# Patient Record
Sex: Female | Born: 1945 | Race: White | Hispanic: No | Marital: Married | Smoking: Former smoker
Health system: Southern US, Community
[De-identification: ages and names within clinical notes are randomized; demographics above are authoritative.]

## PROBLEM LIST (undated history)

## (undated) DIAGNOSIS — E785 Hyperlipidemia, unspecified: Secondary | ICD-10-CM

## (undated) DIAGNOSIS — G2581 Restless legs syndrome: Secondary | ICD-10-CM

## (undated) DIAGNOSIS — B029 Zoster without complications: Secondary | ICD-10-CM

## (undated) DIAGNOSIS — F329 Major depressive disorder, single episode, unspecified: Secondary | ICD-10-CM

## (undated) DIAGNOSIS — B019 Varicella without complication: Secondary | ICD-10-CM

## (undated) DIAGNOSIS — J449 Chronic obstructive pulmonary disease, unspecified: Secondary | ICD-10-CM

## (undated) DIAGNOSIS — F32A Depression, unspecified: Secondary | ICD-10-CM

## (undated) DIAGNOSIS — I10 Essential (primary) hypertension: Secondary | ICD-10-CM

## (undated) HISTORY — DX: Varicella without complication: B01.9

## (undated) HISTORY — PX: BREAST LUMPECTOMY: SHX2

## (undated) HISTORY — DX: Depression, unspecified: F32.A

## (undated) HISTORY — DX: Zoster without complications: B02.9

## (undated) HISTORY — DX: Hyperlipidemia, unspecified: E78.5

## (undated) HISTORY — DX: Major depressive disorder, single episode, unspecified: F32.9

---

## 2005-09-08 ENCOUNTER — Ambulatory Visit: Payer: Self-pay

## 2006-11-17 ENCOUNTER — Ambulatory Visit: Payer: Self-pay

## 2008-03-21 ENCOUNTER — Ambulatory Visit: Payer: Self-pay

## 2009-10-17 LAB — HM PAP SMEAR: HM Pap smear: NORMAL

## 2011-03-09 ENCOUNTER — Emergency Department (HOSPITAL_COMMUNITY): Payer: Medicare Other

## 2011-03-09 ENCOUNTER — Encounter: Payer: Self-pay | Admitting: *Deleted

## 2011-03-09 ENCOUNTER — Emergency Department (HOSPITAL_COMMUNITY)
Admission: EM | Admit: 2011-03-09 | Discharge: 2011-03-09 | Disposition: A | Discharge status: Home / Self Care | Payer: Medicare Other | Attending: Emergency Medicine | Admitting: Emergency Medicine

## 2011-03-09 DIAGNOSIS — E119 Type 2 diabetes mellitus without complications: Secondary | ICD-10-CM | POA: Insufficient documentation

## 2011-03-09 DIAGNOSIS — J449 Chronic obstructive pulmonary disease, unspecified: Secondary | ICD-10-CM | POA: Insufficient documentation

## 2011-03-09 DIAGNOSIS — I10 Essential (primary) hypertension: Secondary | ICD-10-CM | POA: Insufficient documentation

## 2011-03-09 DIAGNOSIS — J4489 Other specified chronic obstructive pulmonary disease: Secondary | ICD-10-CM | POA: Insufficient documentation

## 2011-03-09 DIAGNOSIS — Z7982 Long term (current) use of aspirin: Secondary | ICD-10-CM | POA: Insufficient documentation

## 2011-03-09 DIAGNOSIS — R059 Cough, unspecified: Secondary | ICD-10-CM | POA: Insufficient documentation

## 2011-03-09 DIAGNOSIS — R05 Cough: Secondary | ICD-10-CM | POA: Insufficient documentation

## 2011-03-09 DIAGNOSIS — J40 Bronchitis, not specified as acute or chronic: Secondary | ICD-10-CM

## 2011-03-09 DIAGNOSIS — F17200 Nicotine dependence, unspecified, uncomplicated: Secondary | ICD-10-CM | POA: Insufficient documentation

## 2011-03-09 HISTORY — DX: Essential (primary) hypertension: I10

## 2011-03-09 HISTORY — DX: Chronic obstructive pulmonary disease, unspecified: J44.9

## 2011-03-09 HISTORY — DX: Restless legs syndrome: G25.81

## 2011-03-09 LAB — BASIC METABOLIC PANEL
GFR calc Af Amer: >90 mL/min (ref >90)
GFR calc non Af Amer: 88 mL/min — ABNORMAL LOW (ref >90)
Potassium: 3.7 mEq/L (ref 3.5–5.1)
Sodium: 133 mEq/L — ABNORMAL LOW (ref 135–145)

## 2011-03-09 LAB — URINALYSIS, ROUTINE W REFLEX MICROSCOPIC
Bilirubin Urine: NEGATIVE
Leukocytes, UA: NEGATIVE
Nitrite: NEGATIVE
Specific Gravity, Urine: >1.03 — ABNORMAL HIGH (ref 1.005–1.030)
Urobilinogen, UA: 0.2 mg/dL (ref 0.0–1.0)

## 2011-03-09 LAB — URINE CULTURE

## 2011-03-09 LAB — CBC
MCH: 29.7 pg (ref 26.0–34.0)
MCHC: 32.4 g/dL (ref 30.0–36.0)
Platelets: 207 10*3/uL (ref 150–400)
RBC: 4.92 MIL/uL (ref 3.87–5.11)

## 2011-03-09 LAB — DIFFERENTIAL
Basophils Absolute: 0 10*3/uL (ref 0.0–0.1)
Basophils Relative: 0 % (ref 0–1)
Eosinophils Absolute: 0 10*3/uL (ref 0.0–0.7)
Neutro Abs: 6.4 10*3/uL (ref 1.7–7.7)
Neutrophils Relative %: 78 % — ABNORMAL HIGH (ref 43–77)

## 2011-03-09 MED ORDER — AEROCHAMBER Z-STAT PLUS/MEDIUM MISC
1.0000 | Freq: Once | Status: AC
  Administered 2011-03-09: 1

## 2011-03-09 MED ORDER — IPRATROPIUM BROMIDE 0.02 % IN SOLN
0.5000 mg | Freq: Once | RESPIRATORY_TRACT | Status: AC
  Administered 2011-03-09: 0.5 mg via RESPIRATORY_TRACT
  Filled 2011-03-09: qty 2.5

## 2011-03-09 MED ORDER — ALBUTEROL SULFATE (5 MG/ML) 0.5% IN NEBU
5.0000 mg | INHALATION_SOLUTION | Freq: Once | RESPIRATORY_TRACT | Status: AC
Start: 2011-03-09 — End: 2011-03-09
  Administered 2011-03-09: 5 mg via RESPIRATORY_TRACT
  Filled 2011-03-09: qty 0.5

## 2011-03-09 MED ORDER — ALBUTEROL SULFATE HFA 108 (90 BASE) MCG/ACT IN AERS
2.0000 | INHALATION_SPRAY | RESPIRATORY_TRACT | Status: DC | PRN
  Administered 2011-03-09: 2 via RESPIRATORY_TRACT
  Filled 2011-03-09: qty 6.7

## 2011-03-09 MED ORDER — SODIUM CHLORIDE 0.9 % IV SOLN
INTRAVENOUS | Status: DC
  Administered 2011-03-09: 17:00:00 via INTRAVENOUS

## 2011-03-09 MED ORDER — SODIUM CHLORIDE 0.9 % IV BOLUS (SEPSIS)
500.0000 mL | Freq: Once | INTRAVENOUS | Status: AC
  Administered 2011-03-09: 18:00:00 via INTRAVENOUS

## 2011-03-09 MED ORDER — SODIUM CHLORIDE 0.9 % IV BOLUS (SEPSIS)
500.0000 mL | Freq: Once | INTRAVENOUS | Status: AC
  Administered 2011-03-09: 1000 mL via INTRAVENOUS

## 2011-03-09 NOTE — ED Notes (Signed)
Cough, congestion.  Sent here from [facility].  Body aches

## 2011-03-09 NOTE — ED Provider Notes (Signed)
This chart was scribed for Molly Melter, MD by Williemae Natter. The patient was seen in room APA07/APA07 and the patient's care was started at 3:14 PM.   CSN: [id number]  Arrival date & time 03/09/11  1357   First MD Initiated Contact with Patient 03/09/11 1512      Chief Complaint  Patient presents with  . Influenza    (Consider location/radiation/quality/duration/timing/severity/associated sxs/prior treatment) HPI Molly Mcdonald is a 66 y.o. female with a history of COPD, hypertension, high cholesterol, and diabetes who presents to the Emergency Department complaining of sudden onset body aches. Pt was sent here from [place] [facility] on the PCP's recommendation that she needed IV fluids. Pt states that her blood pressure was unusually low and that she is experiencing headache, cough, fever, congestion, and an ear popping sensation. Pt denies any n/v/d, dysuria, or dizziness. Pt treated with tylenol and Mucinex with little to no improvement. She had a flu shot in October as well as a pneumonia shot. Pt uses oxygen at home with Symbicort. Pt also has a history of restless leg syndrome.  Past Medical History  Diagnosis Date  . Diabetes mellitus   . Restless leg   . Hypertension   . COPD (chronic obstructive pulmonary disease)     Past Surgical History  Procedure Date  . Breast lumpectomy     No family history on file.  History  Substance Use Topics  . Smoking status: Current Everyday Smoker  . Smokeless tobacco: Not on file  . Alcohol Use: No    OB History    Grav Para Term Preterm Abortions TAB SAB Ect Mult Living                  Review of Systems 10 Systems reviewed and are negative for acute change except as noted in the HPI.  Allergies  Tramadol  Home Medications   Current Outpatient Rx  Name Route Sig Dispense Refill  . ASPIRIN EC 81 MG PO TBEC Oral Take 81 mg by mouth every morning.      . BUDESONIDE-FORMOTEROL FUMARATE 160-4.5 MCG/ACT IN AERO  Inhalation Inhale 2 puffs into the lungs 2 (two) times daily.      Marland Kitchen VITAMIN D 1000 UNITS PO TABS Oral Take 1,000 Units by mouth every morning.      . DULOXETINE HCL 60 MG PO CPEP Oral Take 60 mg by mouth every morning.      Marland Kitchen LISINOPRIL-HYDROCHLOROTHIAZIDE 20-12.5 MG PO TABS Oral Take 1 tablet by mouth daily.      Marland Kitchen LORAZEPAM 0.5 MG PO TABS Oral Take 0.5 mg by mouth at bedtime. **May take up to three times daily as needed for upset/anxiety**     . LOVASTATIN 10 MG PO TABS Oral Take 10 mg by mouth at bedtime.      Marland Kitchen METFORMIN HCL ER 750 MG PO TB24 Oral Take 750 mg by mouth 2 (two) times daily.      Marland Kitchen ROPINIROLE HCL 3 MG PO TABS Oral Take 3 mg by mouth 2 (two) times daily. For RLS     . SAXAGLIPTIN HCL 5 MG PO TABS Oral Take 5 mg by mouth every morning.        BP 121/57  Pulse 100  Temp(Src) 98.1 F (36.7 C) (Oral)  Resp 22  Ht 5\' 2"  (1.575 m)  Wt 185 lb (83.915 kg)  BMI 33.84 kg/m2  SpO2 94%  Physical Exam  Nursing note and vitals reviewed. Constitutional: She is  oriented to person, place, and time. She appears well-developed and well-nourished. No distress.  HENT:  Head: Normocephalic and atraumatic.  Eyes: Conjunctivae and EOM are normal. Pupils are equal, round, and reactive to light.  Neck: Normal range of motion. Neck supple.  Cardiovascular: Normal rate, regular rhythm and normal heart sounds.   Pulmonary/Chest: Effort normal. No respiratory distress. She has no wheezes. She has no rales.       Decreased expiratory air movement bilaterally  Abdominal: Soft. Bowel sounds are normal. There is no tenderness.  Musculoskeletal: Normal range of motion. She exhibits no edema and no tenderness.  Neurological: She is alert and oriented to person, place, and time.  Skin: Skin is warm and dry.  Psychiatric: She has a normal mood and affect. Her behavior is normal. Judgment and thought content normal.    ED Course  Procedures (including critical care time) 6:33 PM Recheck: Pt feels  much better, lungs have good air movement, tests are normal, prescribed inhaler, repeat vital signs, normal blood pressure, ambulated without problem.  Labs Reviewed  DIFFERENTIAL - Abnormal; Notable for the following:    Neutrophils Relative 78 (*)    All other components within normal limits  BASIC METABOLIC PANEL - Abnormal; Notable for the following:    Sodium 133 (*)    Chloride 94 (*)    GFR calc non Af Amer 88 (*)    All other components within normal limits  URINALYSIS, ROUTINE W REFLEX MICROSCOPIC - Abnormal; Notable for the following:    Specific Gravity, Urine >1.030 (*)    All other components within normal limits  CBC  LACTIC ACID, PLASMA  URINE CULTURE   Dg Chest 2 View  03/09/2011  *RADIOLOGY REPORT*  Clinical Data: Cough, congestion  CHEST - 2 VIEW  Comparison: None.  Findings: Hyperinflation/emphysematous changes. No pleural effusion or pneumothorax.  Cardiomediastinal silhouette is within normal limits.  Mild degenerative changes of the visualized thoracolumbar spine.  IMPRESSION: No evidence of acute cardiopulmonary disease.  Hyperinflation/emphysematous changes.  Original Report Authenticated By: Charline Bills, M.D.     1. Bronchitis       MDM  Pt treated/improved in ED. Doubt ACS, PNE, metabolic instability or influenza      I personally performed the services described in this documentation, which was scribed in my presence. The recorded information has been reviewed and considered.     Molly Melter, MD 03/10/11 1122

## 2011-03-09 NOTE — ED Notes (Signed)
resp here and pt waiting for ride home.

## 2011-03-09 NOTE — ED Notes (Signed)
Per Dr. Effie Shy pt able to eat.

## 2011-04-12 ENCOUNTER — Ambulatory Visit: Payer: Self-pay | Admitting: Nurse Practitioner

## 2012-05-03 LAB — HM MAMMOGRAPHY: HM MAMMO: NORMAL

## 2012-05-26 ENCOUNTER — Ambulatory Visit: Payer: Self-pay | Admitting: Nurse Practitioner

## 2013-02-19 ENCOUNTER — Ambulatory Visit: Payer: Self-pay | Admitting: Otolaryngology

## 2013-03-06 ENCOUNTER — Ambulatory Visit: Payer: Self-pay | Admitting: Otolaryngology

## 2013-03-06 DIAGNOSIS — E119 Type 2 diabetes mellitus without complications: Secondary | ICD-10-CM

## 2013-03-06 LAB — BASIC METABOLIC PANEL
ANION GAP: 1 — AB (ref 7–16)
BUN: 21 mg/dL — ABNORMAL HIGH (ref 7–18)
Calcium, Total: 9.4 mg/dL (ref 8.5–10.1)
Chloride: 98 mmol/L (ref 98–107)
Co2: 34 mmol/L — ABNORMAL HIGH (ref 21–32)
Creatinine: 0.72 mg/dL (ref 0.60–1.30)
GLUCOSE: 66 mg/dL (ref 65–99)
OSMOLALITY: 268 (ref 275–301)
Potassium: 4.3 mmol/L (ref 3.5–5.1)
Sodium: 133 mmol/L — ABNORMAL LOW (ref 136–145)

## 2013-03-06 LAB — CBC WITH DIFFERENTIAL/PLATELET
Basophil #: 0.1 10*3/uL (ref 0.0–0.1)
Basophil %: 0.8 %
EOS ABS: 0.1 10*3/uL (ref 0.0–0.7)
Eosinophil %: 0.7 %
HCT: 43.4 % (ref 35.0–47.0)
HGB: 14.3 g/dL (ref 12.0–16.0)
LYMPHS PCT: 11 %
Lymphocyte #: 1.1 10*3/uL (ref 1.0–3.6)
MCH: 30.1 pg (ref 26.0–34.0)
MCHC: 33 g/dL (ref 32.0–36.0)
MCV: 91 fL (ref 80–100)
MONO ABS: 0.9 x10 3/mm (ref 0.2–0.9)
Monocyte %: 9.3 %
NEUTROS ABS: 7.5 10*3/uL — AB (ref 1.4–6.5)
Neutrophil %: 78.2 %
PLATELETS: 314 10*3/uL (ref 150–440)
RBC: 4.75 10*6/uL (ref 3.80–5.20)
RDW: 14.2 % (ref 11.5–14.5)
WBC: 9.7 10*3/uL (ref 3.6–11.0)

## 2013-03-14 ENCOUNTER — Ambulatory Visit: Payer: Self-pay | Admitting: Otolaryngology

## 2013-04-04 ENCOUNTER — Encounter (INDEPENDENT_AMBULATORY_CARE_PROVIDER_SITE_OTHER): Payer: Self-pay

## 2013-04-04 ENCOUNTER — Ambulatory Visit (INDEPENDENT_AMBULATORY_CARE_PROVIDER_SITE_OTHER): Payer: Medicare Other | Admitting: Pulmonary Disease

## 2013-04-04 ENCOUNTER — Encounter: Payer: Self-pay | Admitting: Pulmonary Disease

## 2013-04-04 VITALS — BP 140/74 | HR 101 | Ht 61.0 in | Wt 196.0 lb

## 2013-04-04 DIAGNOSIS — J9610 Chronic respiratory failure, unspecified whether with hypoxia or hypercapnia: Secondary | ICD-10-CM

## 2013-04-04 DIAGNOSIS — J441 Chronic obstructive pulmonary disease with (acute) exacerbation: Secondary | ICD-10-CM

## 2013-04-04 DIAGNOSIS — J449 Chronic obstructive pulmonary disease, unspecified: Secondary | ICD-10-CM

## 2013-04-04 MED ORDER — TIOTROPIUM BROMIDE MONOHYDRATE 18 MCG IN CAPS: 18.0000 ug | ORAL_CAPSULE | Freq: Every day | RESPIRATORY_TRACT | Status: DC

## 2013-04-04 MED ORDER — LEVALBUTEROL HCL 0.63 MG/3ML IN NEBU: 0.6300 mg | INHALATION_SOLUTION | RESPIRATORY_TRACT | Status: DC | PRN

## 2013-04-04 NOTE — Patient Instructions (Signed)
Take spiriva daily in addition to the Symbicort Use the symbicort twice a day no matter how you feel, use it with a spacer Use albuterol every four hours as needed for shortness of breath  We will see you back in 4-6 weeks or sooner if needed

## 2013-04-04 NOTE — Assessment & Plan Note (Signed)
Continue 4 L of oxygen continuously 

## 2013-04-04 NOTE — Assessment & Plan Note (Signed)
This lady really has very severe COPD and has significant symptoms from this. This is clearly due to her tobacco use. Fortunately she quit smoking several weeks ago.  She's not currently using her inhaled therapies appropriately and so hopefully we can see some improvement in her symptoms with using them appropriately. I also think she could benefit from pulmonary rehabilitation.  Right now, I don't think it would be safe for her to undergo any elective surgeries. This could all his be reassessed if she does well with appropriate medicines and pulmonary rehabilitation.  Plan: - Use Symbicort twice a day with a spacer number how you feel -Use Spiriva daily -Exercise as much as possible -Use oxygen at 4 L continuously -Refer to pulmonary rehabilitation on next visit

## 2013-04-04 NOTE — Progress Notes (Signed)
Subjective:    Patient ID: Molly Mcdonald, female    DOB: 1945/03/09, 68 y.o.   MRN: [id number]  HPI  [name] is here for [place] to evaluate her perioperative pulmonary risk.  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 She has not had any major surgeries in the last few years.  She had a breast biopsy about 8 years ago and she tolerated that well.   She has had COPD for about 5 years that she knows of.  She previously smoked cigarettes, and quit just 02/23/2013 cold [place] and has not started back.  She smoked 1pack per day for 54 years.  She feels like her breathing is actually getting worse and she has been using the oxygen more with this.  She has had oxygen for four years or so, but she has been wearing it all the time more in the last few weeks.  Previously she would only use it on an as needed basis.  She is not dyspnic at rest, but just walking to the care makes her short of breath.  She has taken symbicort for a few years, but she only uses it on an as needed basis.  She doesn't use it all the time, but when she takes it she feels better.    She doesn't cough regularly since quitting smoking.  Previously she would produce mucus fairly often when smoking.  It was typically clear to slightly yellow.  Past Medical History  Diagnosis Date  . Diabetes mellitus   . Restless leg   . Hypertension   . COPD (chronic obstructive pulmonary disease)      Family History  Problem Relation Age of Onset  . Asthma Maternal Grandfather   . Cancer Mother     breast     History   Social History  . Marital Status: Widowed    Spouse Name: N/A    Number of Children: N/A  . Years of Education: N/A   Occupational History  . Not on file.   Social History Main Topics  . Smoking status: Former Smoker -- 1.00 packs/day for 46 years    Types: Cigarettes    Quit date: 02/23/2013  . Smokeless tobacco: Never Used  . Alcohol Use:  No  . Drug Use: No  . Sexual Activity: Not on file   Other Topics Concern  . Not on file   Social History Narrative  . No narrative on file     Allergies  Allergen Reactions  . Tramadol Hives     Outpatient Prescriptions Prior to Visit  Medication Sig Dispense Refill  . budesonide-formoterol (SYMBICORT) 160-4.5 MCG/ACT inhaler Inhale 2 puffs into the lungs 2 (two) times daily.        . cholecalciferol (VITAMIN D) 1000 UNITS tablet Take 1,000 Units by mouth every morning.        Marland Kitchen LORazepam (ATIVAN) 0.5 MG tablet Take 0.5 mg by mouth at bedtime. **May take up to three times daily as needed for upset/anxiety**       . lovastatin (MEVACOR) 10 MG tablet Take 10 mg by mouth at bedtime.        . metFORMIN (GLUCOPHAGE-XR) 750 MG 24 hr tablet Take 750 mg by mouth 2 (two) times daily.        Marland Kitchen rOPINIRole (REQUIP) 3 MG tablet Take 3 mg by mouth 2 (two) times daily. For RLS       .  saxagliptin HCl (ONGLYZA) 5 MG TABS tablet Take 5 mg by mouth every morning.        Marland Kitchen aspirin EC 81 MG tablet Take 81 mg by mouth every morning.        . DULoxetine (CYMBALTA) 60 MG capsule Take 60 mg by mouth every morning.        Marland Kitchen lisinopril-hydrochlorothiazide (PRINZIDE,ZESTORETIC) 20-12.5 MG per tablet Take 1 tablet by mouth daily.         No facility-administered medications prior to visit.      Review of Systems  Constitutional: Positive for unexpected weight change. Negative for fever.  HENT: Negative for congestion, dental problem, ear pain, nosebleeds, postnasal drip, rhinorrhea, sinus pressure, sneezing, sore throat and trouble swallowing.   Eyes: Negative for redness and itching.  Respiratory: Positive for cough and shortness of breath. Negative for chest tightness and wheezing.   Cardiovascular: Negative for palpitations and leg swelling.  Gastrointestinal: Negative for nausea and vomiting.  Genitourinary: Negative for dysuria.  Musculoskeletal: Negative for joint swelling.  Skin: Negative for  rash.  Neurological: Negative for headaches.  Hematological: Does not bruise/bleed easily.  Psychiatric/Behavioral: Positive for dysphoric mood. The patient is nervous/anxious.        Objective:   Physical Exam Filed Vitals:   04/04/13 1129  BP: 140/74  Pulse: 101  Height: 5\' 1"  (1.549 m)  Weight: 196 lb (88.905 kg)  SpO2: 93%   4L [place]  Gen: chronically ill appearing, no acute distress HEENT: NCAT, PERRL, EOMi, OP clear, neck supple without masses PULM: wheezing , poor air movement bilaterally CV: RRR, no mgr, no JVD AB: BS+, soft, nontender, no hsm Ext: warm, trace edema, no clubbing, no cyanosis Derm: no rash or skin breakdown Neuro: A&Ox4, CN II-XII intact, strength 5/5 in all 4 extremities  A December 2014 CT scan of her neck and soft tissue showed severe emphysema in the lung windows which were available      Assessment & Plan:   COPD: GOLD GRADE D This lady really has very severe COPD and has significant symptoms from this. This is clearly due to her tobacco use. Fortunately she quit smoking several weeks ago.  She's not currently using her inhaled therapies appropriately and so hopefully we can see some improvement in her symptoms with using them appropriately. I also think she could benefit from pulmonary rehabilitation.  Right now, I don't think it would be safe for her to undergo any elective surgeries. This could all his be reassessed if she does well with appropriate medicines and pulmonary rehabilitation.  Plan: - Use Symbicort twice a day with a spacer number how you feel -Use Spiriva daily -Exercise as much as possible -Use oxygen at 4 L continuously -Refer to pulmonary rehabilitation on next visit  Chronic respiratory failure Continue 4 L of oxygen continuously.    Updated Medication List Outpatient Encounter Prescriptions as of 04/04/2013  Medication Sig  . budesonide-formoterol (SYMBICORT) 160-4.5 MCG/ACT inhaler Inhale 2 puffs into the lungs 2  (two) times daily.    . cholecalciferol (VITAMIN D) 1000 UNITS tablet Take 1,000 Units by mouth every morning.    Marland Kitchen lisinopril (PRINIVIL,ZESTRIL) 20 MG tablet Take 20 mg by mouth daily.  Marland Kitchen LORazepam (ATIVAN) 0.5 MG tablet Take 0.5 mg by mouth at bedtime. **May take up to three times daily as needed for upset/anxiety**   . lovastatin (MEVACOR) 10 MG tablet Take 10 mg by mouth at bedtime.    . metFORMIN (GLUCOPHAGE-XR) 750 MG 24 hr  tablet Take 750 mg by mouth 2 (two) times daily.    Marland Kitchen. rOPINIRole (REQUIP) 3 MG tablet Take 3 mg by mouth 2 (two) times daily. For RLS   . saxagliptin HCl (ONGLYZA) 5 MG TABS tablet Take 5 mg by mouth every morning.    . [DISCONTINUED] aspirin EC 81 MG tablet Take 81 mg by mouth every morning.    . [DISCONTINUED] DULoxetine (CYMBALTA) 60 MG capsule Take 60 mg by mouth every morning.    . [DISCONTINUED] lisinopril-hydrochlorothiazide (PRINZIDE,ZESTORETIC) 20-12.5 MG per tablet Take 1 tablet by mouth daily.

## 2013-04-06 ENCOUNTER — Telehealth: Payer: Self-pay | Admitting: Pulmonary Disease

## 2013-04-06 DIAGNOSIS — J449 Chronic obstructive pulmonary disease, unspecified: Secondary | ICD-10-CM

## 2013-04-06 NOTE — Telephone Encounter (Signed)
Sent in order for port 02 concentrater to [employer].  Nothing further needed.

## 2013-04-10 ENCOUNTER — Telehealth: Payer: Self-pay | Admitting: Pulmonary Disease

## 2013-04-10 DIAGNOSIS — J449 Chronic obstructive pulmonary disease, unspecified: Secondary | ICD-10-CM

## 2013-04-10 NOTE — Telephone Encounter (Signed)
Called the number provided above - automated message stated "the number is not working or has been disconnected"  Pt last seen 2.4.15 by BQ No mention of nebs at ov and no nebs on pt's med list  Will try to call back tomorrow

## 2013-04-11 ENCOUNTER — Telehealth: Payer: Self-pay | Admitting: Pulmonary Disease

## 2013-04-11 NOTE — Telephone Encounter (Signed)
At last OV BQ ordered xopenex neb meds for the pt but somehow it was discontinued that same day by the nurse off of the med list. Pt states she has the neb meds but not a machine that is why the company is calling us. I advised we will get an order sent to them for a neb machine and supplies. Pt needs this sent to [facility] at [phone], contact # is [phone]. Order has been placed. I also spoke with Cassie at [facility] and advised order will be sent.  Carron CurieJennifer Castillo, CMA

## 2013-04-11 NOTE — Telephone Encounter (Signed)
Order was faxed at 3:10. Nothing further needed. Carron CurieJennifer Castillo, CMA

## 2013-04-18 ENCOUNTER — Telehealth: Payer: Self-pay | Admitting: Pulmonary Disease

## 2013-04-18 NOTE — Telephone Encounter (Signed)
Any of the docs in the [facility] [place] [facility] office or the [facility] office;  Or [facility]; or [facility]: Tim LairGilbers, [place], [place]

## 2013-04-18 NOTE — Telephone Encounter (Signed)
Called, spoke with pt's family member per her request.  Informed him of below.  He verbalized understanding.

## 2013-04-18 NOTE — Telephone Encounter (Signed)
Called, spoke with pt -  Per pt, BQ advised she needed to establish with a MD for pcp care.  Pt states BQ named several different drs but she doesn't remember the names.  Dr. Kendrick FriesMcQuaid, pt is requesting your recs on PCP in [place] area.  Pls advise.  Thank you.

## 2013-05-02 ENCOUNTER — Ambulatory Visit (INDEPENDENT_AMBULATORY_CARE_PROVIDER_SITE_OTHER): Payer: Medicare Other | Admitting: Pulmonary Disease

## 2013-05-02 ENCOUNTER — Encounter: Payer: Self-pay | Admitting: Pulmonary Disease

## 2013-05-02 VITALS — BP 124/62 | HR 88 | Ht 61.0 in | Wt 200.0 lb

## 2013-05-02 DIAGNOSIS — F3289 Other specified depressive episodes: Secondary | ICD-10-CM

## 2013-05-02 DIAGNOSIS — F329 Major depressive disorder, single episode, unspecified: Secondary | ICD-10-CM | POA: Insufficient documentation

## 2013-05-02 DIAGNOSIS — J9610 Chronic respiratory failure, unspecified whether with hypoxia or hypercapnia: Secondary | ICD-10-CM

## 2013-05-02 DIAGNOSIS — J449 Chronic obstructive pulmonary disease, unspecified: Secondary | ICD-10-CM

## 2013-05-02 DIAGNOSIS — F32A Depression, unspecified: Secondary | ICD-10-CM

## 2013-05-02 MED ORDER — TIOTROPIUM BROMIDE MONOHYDRATE 18 MCG IN CAPS: 18.0000 ug | ORAL_CAPSULE | Freq: Every day | RESPIRATORY_TRACT | Status: DC

## 2013-05-02 MED ORDER — BUPROPION HCL ER (SR) 150 MG PO TB12: 150.0000 mg | ORAL_TABLET | Freq: Two times a day (BID) | ORAL | Status: DC

## 2013-05-02 NOTE — Assessment & Plan Note (Signed)
Today when she came in on pulsed 4 L of oxygen her O2 saturation was only 88%. When we changed it to continuous improved into the 90s.  Plan: -She really needs to be on continuous 4 L 24 hours a day -She would benefit from a portable oxygen concentrator

## 2013-05-02 NOTE — Progress Notes (Signed)
Subjective:    Patient ID: Molly Mcdonald, female    DOB: 1945/04/09, 68 y.o.   MRN: [id number]  Synopsis: This is a very pleasant lady who first saw the [facility] in February 2015 for evaluation of COPD. She had been treated with oxygen for several years and used 4 L continuously on her first visit. A simple spirometry in February 2015 showed severe airflow obstruction with an FEV1 of 40% predicted.   HPI  05/02/2013 ROV> Since the last visit Molly Mcdonald thinks that she has been doing OK.  It's hard to say if has really made a difference or not.  She thinks that the symbocirt works better than the Spiriva.  She thinks that the shortness of breath is a little better but she still has it.  She uses albuterol nebulizer regularly, and this helps loosen up her mucus which helps some.  She notes that she has depression and she cries all the time.  She takes lexapro and Wellbutrin.  She takes the Wellbutrin once a day, and she notes that she feels worse in the evenings.    Past Medical History  Diagnosis Date  . Diabetes mellitus   . Restless leg   . Hypertension   . COPD (chronic obstructive pulmonary disease)      Review of Systems  Constitutional: Negative for fever, chills and fatigue.  HENT: Negative for postnasal drip, rhinorrhea and sinus pressure.   Respiratory: Positive for cough and shortness of breath. Negative for wheezing.   Cardiovascular: Negative for chest pain, palpitations and leg swelling.  Psychiatric/Behavioral: Positive for dysphoric mood. Negative for suicidal ideas and self-injury. The patient is nervous/anxious.        Objective:   Physical Exam Filed Vitals:   05/02/13 1147  BP: 124/62  Pulse: 88  Height: 5\' 1"  (1.549 m)  Weight: 200 lb (90.719 kg)  SpO2: 88%   4L pulse, improved to 92% on 4L continuous  Gen: chronically ill appearing, no acute distress HEENT: NCAT, PERRL  PULM: Poor air movement, minimal wheezing CV: RRR, s1/s2  wnl AB: BS+, soft, nontender Ext: warm, trace edema Neuro: A&Ox4, maew       Assessment & Plan:   COPD: GOLD GRADE D She has severe disease this is been a stable interval for [name]. She does better when taking the Symbicort and Spiriva daily. She needs to start pulmonary rehabilitation soon.  Plan:  -start pulmonary rehabilitation -Continue Symbicort and Spiriva -See chronic hypoxemic respiratory failure below  Chronic respiratory failure Today when she came in on pulsed 4 L of oxygen her O2 saturation was only 88%. When we changed it to continuous improved into the 90s.  Plan: -She really needs to be on continuous 4 L 24 hours a day -She would benefit from a portable oxygen concentrator  Depression She takes Lexapro and Wellbutrin for this but despite that states that she's been having a lot of crying spells lately. Typically this is worse in the afternoon. She has not sought counseling yet. She is not suicidal and contracts for safety with daily.  Plan: -We recommended that she see a counselor in that area, contact information was given -Increase Wellbutrin to twice a day -Followup with primary care doctor here in our clinic    Updated Medication List Outpatient Encounter Prescriptions as of 05/02/2013  Medication Sig  . alendronate (FOSAMAX) 70 MG tablet Take 70 mg by mouth once a week. Take with a full glass of water on an empty  stomach.  . budesonide-formoterol (SYMBICORT) 160-4.5 MCG/ACT inhaler Inhale 2 puffs into the lungs 2 (two) times daily.    Marland Kitchen buPROPion (WELLBUTRIN SR) 150 MG 12 hr tablet Take 150 mg by mouth daily.  . cholecalciferol (VITAMIN D) 1000 UNITS tablet Take 1,000 Units by mouth every morning.    . escitalopram (LEXAPRO) 20 MG tablet Take 20 mg by mouth daily.  Marland Kitchen levalbuterol (XOPENEX) 0.31 MG/3ML nebulizer solution Take 1 ampule by nebulization every 4 (four) hours as needed for wheezing.  Marland Kitchen lisinopril (PRINIVIL,ZESTRIL) 20 MG tablet Take 20 mg by  mouth daily.  Marland Kitchen LORazepam (ATIVAN) 0.5 MG tablet Take 0.5 mg by mouth at bedtime. **May take up to three times daily as needed for upset/anxiety**   . lovastatin (MEVACOR) 10 MG tablet Take 10 mg by mouth at bedtime.    . metFORMIN (GLUCOPHAGE-XR) 750 MG 24 hr tablet Take 750 mg by mouth 2 (two) times daily.    . pramipexole (MIRAPEX) 1.5 MG tablet Take 1.5 mg by mouth 2 (two) times daily as needed.

## 2013-05-02 NOTE — Patient Instructions (Signed)
We will refer you to pulmonary rehab  Increase your dose of Wellbutrin to twice a day, take the second dose by 2pm Call Ludwig ClarksValerie Padgett for counseling services  We will help you arrange a primary care visit with Dr. Darrick Huntsmanullo  Increase your home oxygen dose to 4L continuously  Keep taking your inhaled medicines as you are doing  We will see you back in 3 months or sooner if needed

## 2013-05-02 NOTE — Assessment & Plan Note (Addendum)
She has severe disease this is been a stable interval for [facility]. She does better when taking the Symbicort and Spiriva daily. She needs to start pulmonary rehabilitation soon.  Plan:  -start pulmonary rehabilitation -Continue Symbicort and Spiriva -See chronic hypoxemic respiratory failure below

## 2013-05-02 NOTE — Assessment & Plan Note (Signed)
She takes Lexapro and Wellbutrin for this but despite that states that she's been having a lot of crying spells lately. Typically this is worse in the afternoon. She has not sought counseling yet. She is not suicidal and contracts for safety with daily.  Plan: -We recommended that she see a counselor in that area, contact information was given -Increase Wellbutrin to twice a day -Followup with primary care doctor here in our clinic

## 2013-05-24 ENCOUNTER — Telehealth: Payer: Self-pay | Admitting: Pulmonary Disease

## 2013-05-24 NOTE — Telephone Encounter (Signed)
Called and spoke with Misty StanleyLisa. Pt contacted them wanting a POC order. She is faxing order over to us for BQ to sign and fax back.  Will await fax. This is being faxed to triage. They will also need pt testing results sent over with the fax. Will forward to [place] to f/u on.

## 2013-05-24 NOTE — Telephone Encounter (Signed)
Pt is asking to speak w/ BQ or his nurse.  Pt would like to know if she is going to need an appt for more testing to qualify for the O2.  Pt can be reached at [phone].  Antionette FairyHolly D Pryor

## 2013-05-24 NOTE — Telephone Encounter (Signed)
Spoke with pt, let her know that we just received the paperwork from [employer] this morning and are currently filling it out.  Since her DLV was 05/02/13 she should not need another visit to re-qualify but we will contact her if we need to.  She understands this.  Form will be faxed this afternoon to [employer].  Nothing further needed at this time.

## 2013-05-25 ENCOUNTER — Telehealth: Payer: Self-pay | Admitting: Pulmonary Disease

## 2013-05-25 DIAGNOSIS — J449 Chronic obstructive pulmonary disease, unspecified: Secondary | ICD-10-CM

## 2013-05-25 NOTE — Telephone Encounter (Signed)
[facility] is now closed. Will call Monday AM

## 2013-05-28 ENCOUNTER — Telehealth: Payer: Self-pay | Admitting: Pulmonary Disease

## 2013-05-28 NOTE — Telephone Encounter (Signed)
05/28/2013 9:32 AM Phone (Incoming) linda [phone] ext [phone] linda calling from [facility] [employer] returning call to triage  [id number] for linda, ext not working. Carron CurieJennifer Donnita Farina, CMA

## 2013-05-28 NOTE — Telephone Encounter (Signed)
lmtcbx2 for linda. Carron CurieJennifer Clyda Smyth, CMA

## 2013-05-28 NOTE — Telephone Encounter (Signed)
Duplicate message. Tasfia Vasseur, CMA  

## 2013-05-28 NOTE — Telephone Encounter (Signed)
Return call

## 2013-05-28 NOTE — Telephone Encounter (Signed)
ATC multiple times, could never get a person on the phone. [id number] on voicemail. Carron CurieJennifer Castillo, CMA

## 2013-05-28 NOTE — Telephone Encounter (Signed)
Bonita QuinLinda return call.Caren GriffinsStanley A Dalton

## 2013-05-29 NOTE — Telephone Encounter (Signed)
Molly Mcdonald from [employer] returned call.  Please call [phone] and press [phone] when automated system comes up and wait for operator. If operator never answers please call the number again and enter ext [phone]. This is her supervisor and she will get a hold of Molly Mcdonald.  They are having problems with their phone system.

## 2013-05-29 NOTE — Telephone Encounter (Signed)
Called as advised and [facility] for linda.

## 2013-05-30 ENCOUNTER — Encounter: Payer: Self-pay | Admitting: Internal Medicine

## 2013-05-30 ENCOUNTER — Encounter (INDEPENDENT_AMBULATORY_CARE_PROVIDER_SITE_OTHER): Payer: Self-pay

## 2013-05-30 ENCOUNTER — Ambulatory Visit (INDEPENDENT_AMBULATORY_CARE_PROVIDER_SITE_OTHER): Payer: Medicare Other | Admitting: Internal Medicine

## 2013-05-30 ENCOUNTER — Ambulatory Visit: Payer: Medicare Other | Admitting: Internal Medicine

## 2013-05-30 VITALS — BP 164/82 | HR 107 | Temp 98.7°F | Resp 18 | Ht 61.0 in | Wt 199.8 lb

## 2013-05-30 DIAGNOSIS — F329 Major depressive disorder, single episode, unspecified: Secondary | ICD-10-CM

## 2013-05-30 DIAGNOSIS — F32A Depression, unspecified: Secondary | ICD-10-CM

## 2013-05-30 DIAGNOSIS — I10 Essential (primary) hypertension: Secondary | ICD-10-CM

## 2013-05-30 DIAGNOSIS — E1149 Type 2 diabetes mellitus with other diabetic neurological complication: Secondary | ICD-10-CM

## 2013-05-30 DIAGNOSIS — F3289 Other specified depressive episodes: Secondary | ICD-10-CM

## 2013-05-30 DIAGNOSIS — F339 Major depressive disorder, recurrent, unspecified: Secondary | ICD-10-CM

## 2013-05-30 DIAGNOSIS — E119 Type 2 diabetes mellitus without complications: Secondary | ICD-10-CM

## 2013-05-30 DIAGNOSIS — R5381 Other malaise: Secondary | ICD-10-CM

## 2013-05-30 DIAGNOSIS — J441 Chronic obstructive pulmonary disease with (acute) exacerbation: Secondary | ICD-10-CM

## 2013-05-30 DIAGNOSIS — E559 Vitamin D deficiency, unspecified: Secondary | ICD-10-CM

## 2013-05-30 DIAGNOSIS — E785 Hyperlipidemia, unspecified: Secondary | ICD-10-CM

## 2013-05-30 DIAGNOSIS — R5383 Other fatigue: Secondary | ICD-10-CM

## 2013-05-30 LAB — COMPREHENSIVE METABOLIC PANEL
ALT: 19 U/L (ref 0–35)
AST: 17 U/L (ref 0–37)
Albumin: 4.1 g/dL (ref 3.5–5.2)
Alkaline Phosphatase: 57 U/L (ref 39–117)
BUN: 16 mg/dL (ref 6–23)
CHLORIDE: 98 meq/L (ref 96–112)
CO2: 29 mEq/L (ref 19–32)
Calcium: 9.8 mg/dL (ref 8.4–10.5)
Creatinine, Ser: 0.9 mg/dL (ref 0.4–1.2)
GFR: 66.09 mL/min (ref >60.00)
GLUCOSE: 81 mg/dL (ref 70–99)
Potassium: 4.5 mEq/L (ref 3.5–5.1)
Sodium: 137 mEq/L (ref 135–145)
Total Bilirubin: 0.5 mg/dL (ref 0.3–1.2)
Total Protein: 7.9 g/dL (ref 6.0–8.3)

## 2013-05-30 LAB — HEMOGLOBIN A1C: HEMOGLOBIN A1C: 7 % — AB (ref 4.6–6.5)

## 2013-05-30 LAB — CBC WITH DIFFERENTIAL/PLATELET
BASOS ABS: 0 10*3/uL (ref 0.0–0.1)
BASOS PCT: 0.4 % (ref 0.0–3.0)
EOS ABS: 0.1 10*3/uL (ref 0.0–0.7)
Eosinophils Relative: 0.9 % (ref 0.0–5.0)
HCT: 44.9 % (ref 36.0–46.0)
Hemoglobin: 14.5 g/dL (ref 12.0–15.0)
Lymphocytes Relative: 13.6 % (ref 12.0–46.0)
Lymphs Abs: 1.4 10*3/uL (ref 0.7–4.0)
MCHC: 32.3 g/dL (ref 30.0–36.0)
MCV: 90.8 fl (ref 78.0–100.0)
Monocytes Absolute: 0.7 10*3/uL (ref 0.1–1.0)
Monocytes Relative: 7 % (ref 3.0–12.0)
Neutro Abs: 8 10*3/uL — ABNORMAL HIGH (ref 1.4–7.7)
Neutrophils Relative %: 78.1 % — ABNORMAL HIGH (ref 43.0–77.0)
Platelets: 279 10*3/uL (ref 150.0–400.0)
RBC: 4.95 Mil/uL (ref 3.87–5.11)
RDW: 14.7 % — ABNORMAL HIGH (ref 11.5–14.6)
WBC: 10.2 10*3/uL (ref 4.5–10.5)

## 2013-05-30 LAB — MICROALBUMIN / CREATININE URINE RATIO
Creatinine,U: 64.3 mg/dL
Microalb Creat Ratio: 25.2 mg/g (ref 0.0–30.0)
Microalb, Ur: 16.2 mg/dL — ABNORMAL HIGH (ref 0.0–1.9)

## 2013-05-30 MED ORDER — DOXYCYCLINE HYCLATE 100 MG PO TABS: 100.0000 mg | ORAL_TABLET | Freq: Two times a day (BID) | ORAL | Status: AC

## 2013-05-30 MED ORDER — BLOOD PRESSURE MONITOR AUTOMAT DEVI: Status: AC

## 2013-05-30 MED ORDER — PREDNISONE (PAK) 10 MG PO TABS: ORAL_TABLET | ORAL | Status: DC

## 2013-05-30 NOTE — Telephone Encounter (Signed)
4L pulse Will need them to walk her if possible to confirm this is enough

## 2013-05-30 NOTE — Telephone Encounter (Signed)
Spoke with Bonita QuinLinda at [facility] [employer].  She states that Dr Kendrick FriesMcquaid wants pt to have a portable concentrator and be on $L continuous.  She states that the portable concentrators will only go up to 3L continuous but will go up to 9L on pulse.  Could pt have order for either 3L continuous or 4L or higher pulse.  Please advise.  When calling Bonita QuinLinda back , call [phone] and press O for operator and she will page her.

## 2013-05-30 NOTE — Patient Instructions (Signed)
It was very nice meeting you!  Referral to Dr Al Corpushyatt for podiatry pending  You can take your metformin tablets together since they are XR  Please check you BP once or twice a week and call me if the readings are consistently > 140/80  Return in  3 months for next visit /wellness exam

## 2013-05-30 NOTE — Progress Notes (Signed)
Patient ID: Molly Mcdonald, female   DOB: 01-Sep-1945, 68 y.o.   MRN: [id number]   Patient Active Problem List   Diagnosis Date Noted  . Essential hypertension, benign 06/01/2013  . Other and unspecified hyperlipidemia 06/01/2013  . Type II or unspecified type diabetes mellitus without mention of complication, not stated as uncontrolled 06/01/2013  . COPD exacerbation 05/30/2013  . Depression 05/02/2013  . COPD: GOLD GRADE D 04/04/2013  . Chronic respiratory failure 04/04/2013    Subjective:  CC:   Chief Complaint  Patient presents with  . Establish Care    HPI:   Molly MinerBetty Teyssieris a [age] y.o. female who presents as a new patient with Cc: depression  Previously with once daily lexapro  and twice daily wellbutrin since march 4 with no significant improvement  in anxiety.   Insomnia /axniety at night,  Then sleeps all day chronic .  No pain,  Wears 02 at night ,  No sleep apnea.    2) COPD,  Severe , 02 requiring, with decreased FVC by prior PFTs.  In process of getting a portable 02 unit . Does not drive due to macular degeneration since 2007  3) DM type 2:  Sees podiatry ,  Last one 2 months ago but requesting  a different podiatrist .  For dystrophic nails.  Lives in [place], last A1c was  Over 3 months ago and "a little high' at 6.8 or so.  Taking Metformin XR 1500 mg daily.    Recent sugars have been 90 to 110 fasting  And at  8 pm.  No recent lows   History of right breast benign mass   recent history of tumor on right side of neck,  Biopsied and benign by [facility] [place].   Quit smoking 95 days ago  1 pack daily max          Past Medical History  Diagnosis Date  . Diabetes mellitus   . Restless leg   . Hypertension   . COPD (chronic obstructive pulmonary disease)   . Depression   . Chicken pox   . Shingles   . Hyperlipidemia        @ALL @  Past Surgical History  Procedure Laterality Date  . Breast lumpectomy      History   Social History  . Marital  Status: Widowed    Spouse Name: Molly Mcdonald    Number of Children: Molly Mcdonald  . Years of Education: Molly Mcdonald   Occupational History  . Not on file.   Social History Main Topics  . Smoking status: Former Smoker -- 1.00 packs/day for 46 years    Types: Cigarettes    Quit date: 02/23/2013  . Smokeless tobacco: Never Used  . Alcohol Use: No  . Drug Use: No  . Sexual Activity: Not on file   Other Topics Concern  . Not on file   Social History Narrative  . No narrative on file    @FAMH @       Review of Systems:   The rest of the review of systems was negative except those addressed in the HPI.      Objective:  BP 164/82  Pulse 107  Temp(Src) 98.7 F (37.1 C) (Oral)  Resp 18  Ht 5\' 1"  (1.549 m)  Wt 199 lb 12 oz (90.606 kg)  BMI 37.76 kg/m2  SpO2 93%  General appearance: alert, cooperative and appears stated age Ears: normal TM's and external ear canals both ears Throat: lips, mucosa, and tongue normal; teeth  and gums normal Neck: no adenopathy, no carotid bruit, supple, symmetrical, trachea midline and thyroid not enlarged, symmetric, no tenderness/mass/nodules Back: symmetric, no curvature. ROM normal. No CVA tenderness. Lungs: clear to auscultation bilaterally Heart: regular rate and rhythm, S1, S2 normal, no murmur, click, rub or gallop Abdomen: soft, non-tender; bowel sounds normal; no masses,  no organomegaly Pulses: 2+ and symmetric Skin: Skin color, texture, turgor normal. No rashes or lesions Lymph nodes: Cervical, supraclavicular, and axillary nodes normal.  Assessment and Plan:  Essential hypertension, benign Elevated today.  Reviewed list of meds, patient is not taking OTC meds that could be causing it.  Have asked patient to recheck bp at home a minimum of 5 times over the next 4 weeks and call readings to office for adjustment of medications.    Other and unspecified hyperlipidemia Managed with mevacor,  Return for fasting lipids. Given concurrent DM will  recommend starting baby aspirin .  Goal LDL is 70   Type II or unspecified type diabetes mellitus without mention of complication, not stated as uncontrolled Managed currently with metformin  ,  ACE inhibitor and statin . Marland Kitchena1c is 7.0 Reminder for annual diabetic eye exam given..  Foot exam done. Needs referral to podiatry  Meds reviewed and she is not taking a baby aspirin daily., but does take a statin and an ACE inhibitor.  Urine tested for protein.    Depression Complicated by anxiety and insomnia. There has been no improvement in mood since wellbutrin was increased to twice daily by Dr Kendrick Fries   Will increase wellbutrin to  200 mg  And if no improvement or not tolerated, will change therapy to either remeron at bedtime or buspar bid/tid   Updated Medication List Outpatient Encounter Prescriptions as of 05/30/2013  Medication Sig  . alendronate (FOSAMAX) 70 MG tablet Take 70 mg by mouth once a week. Take with a full glass of water on an empty stomach.  . budesonide-formoterol (SYMBICORT) 160-4.5 MCG/ACT inhaler Inhale 2 puffs into the lungs 2 (two) times daily.    Marland Kitchen buPROPion (WELLBUTRIN SR) 200 MG 12 hr tablet Take 1 tablet (200 mg total) by mouth 2 (two) times daily.  . cholecalciferol (VITAMIN D) 1000 UNITS tablet Take 1,000 Units by mouth every morning.    . escitalopram (LEXAPRO) 20 MG tablet Take 20 mg by mouth daily.  Marland Kitchen levalbuterol (XOPENEX) 0.31 MG/3ML nebulizer solution Take 1 ampule by nebulization every 4 (four) hours as needed for wheezing.  Marland Kitchen lisinopril (PRINIVIL,ZESTRIL) 20 MG tablet Take 20 mg by mouth daily.  Marland Kitchen LORazepam (ATIVAN) 0.5 MG tablet Take 0.5 mg by mouth at bedtime. **May take up to three times daily as needed for upset/anxiety**   . lovastatin (MEVACOR) 10 MG tablet Take 10 mg by mouth at bedtime.    . metFORMIN (GLUCOPHAGE-XR) 750 MG 24 hr tablet Take 750 mg by mouth 2 (two) times daily.    . pramipexole (MIRAPEX) 1.5 MG tablet Take 1.5 mg by mouth 2 (two) times  daily as needed.  . tiotropium (SPIRIVA HANDIHALER) 18 MCG inhalation capsule Place 1 capsule (18 mcg total) into inhaler and inhale daily.  . [DISCONTINUED] buPROPion (WELLBUTRIN SR) 150 MG 12 hr tablet Take 1 tablet (150 mg total) by mouth 2 (two) times daily.  . Blood Pressure Monitoring (BLOOD PRESSURE MONITOR AUTOMAT) DEVI Use to check blood pressure twice weekly    401.9  . doxycycline (VIBRA-TABS) 100 MG tablet Take 1 tablet (100 mg total) by mouth 2 (two) times  daily. WITH /AFTER A MEAL  . predniSONE (STERAPRED UNI-PAK) 10 MG tablet 6 tablets on Day 1 , then reduce by 1 tablet daily until gone

## 2013-05-30 NOTE — Telephone Encounter (Signed)
Called and operator transferred me but [facility]

## 2013-05-30 NOTE — Progress Notes (Signed)
Pre-visit discussion using our clinic review tool. No additional management support is needed unless otherwise documented below in the visit note.  

## 2013-05-31 LAB — TSH: TSH: 1.69 u[IU]/mL (ref 0.35–5.50)

## 2013-05-31 LAB — VITAMIN D 25 HYDROXY (VIT D DEFICIENCY, FRACTURES): Vit D, 25-Hydroxy: 47 ng/mL (ref 30–89)

## 2013-05-31 NOTE — Telephone Encounter (Signed)
ATC x 4-told each time that the number is not working at this time.

## 2013-06-01 ENCOUNTER — Other Ambulatory Visit: Payer: Self-pay | Admitting: Internal Medicine

## 2013-06-01 ENCOUNTER — Encounter: Payer: Self-pay | Admitting: Internal Medicine

## 2013-06-01 DIAGNOSIS — E119 Type 2 diabetes mellitus without complications: Secondary | ICD-10-CM | POA: Insufficient documentation

## 2013-06-01 DIAGNOSIS — F339 Major depressive disorder, recurrent, unspecified: Secondary | ICD-10-CM | POA: Insufficient documentation

## 2013-06-01 DIAGNOSIS — I10 Essential (primary) hypertension: Secondary | ICD-10-CM | POA: Insufficient documentation

## 2013-06-01 DIAGNOSIS — E785 Hyperlipidemia, unspecified: Secondary | ICD-10-CM | POA: Insufficient documentation

## 2013-06-01 MED ORDER — BUPROPION HCL ER (SR) 200 MG PO TB12: 200.0000 mg | ORAL_TABLET | Freq: Two times a day (BID) | ORAL | Status: DC

## 2013-06-01 NOTE — Assessment & Plan Note (Signed)
Managed with mevacor,  Return for fasting lipids. Given concurrent DM will recommend starting baby aspirin .  Goal LDL is 70

## 2013-06-01 NOTE — Assessment & Plan Note (Addendum)
Complicated by anxiety and insomnia. There has been no improvement in mood since wellbutrin was increased to twice daily by Dr Kendrick FriesMcQuaid   Will increase wellbutrin to  200 mg  And if no improvement or not tolerated, will change therapy to either remeron at bedtime or buspar bid/tid

## 2013-06-01 NOTE — Assessment & Plan Note (Signed)
Elevated today.  Reviewed list of meds, patient is not taking OTC meds that could be causing it.  Have asked patient to recheck bp at home a minimum of 5 times over the next 4 weeks and call readings to office for adjustment of medications.

## 2013-06-01 NOTE — Assessment & Plan Note (Addendum)
Managed currently with metformin  ,  ACE inhibitor and statin . Marland Kitchen.a1c is 7.0 Reminder for annual diabetic eye exam given..  Foot exam done. Needs referral to podiatry  Meds reviewed and she is not taking a baby aspirin daily., but does take a statin and an ACE inhibitor.  Urine tested for protein.

## 2013-06-04 ENCOUNTER — Telehealth: Payer: Self-pay | Admitting: *Deleted

## 2013-06-04 NOTE — Telephone Encounter (Signed)
Patient notified

## 2013-06-04 NOTE — Telephone Encounter (Signed)
Molly Mcdonald returned call. Spoke with Molly Mcdonald and advised that BQ okayed for pt to be on 4L pulsed dose but would like for her to be checked on this liter flow to ensure it sustains her sats.  Molly Mcdonald stated that [employer] is unable to do this legally as they are the company providing the equipment.  An order does need to be placed (she could not take verbal) and she will fax a Physician Order form to triage to my attention for BQ to sign.  Called spoke patient to schedule walking oximetry on the 4L pulsed.  Her current portable O2 will only go up to 3.5L so we are unable to walk her without the proper equipment.    LMOM TCB x1 for Molly Mcdonald to see when pt's portable concentrator will be delivered so we can titrate pt.  [place] from [place], will continue to hold in triage.

## 2013-06-04 NOTE — Telephone Encounter (Signed)
Bonita QuinLinda returned call - spoke with Bonita QuinLinda who stated that we have 2 options :: (1) [employer] can go ahead and send pt the portable concentrator and she will have it for a "trial period" where she will still have her current equipment so she can see if it will work for her, but BQ will need to sign the Physician Order or (2) she can contact her current DME company to ask for a regulator that she can put on her tank to convert it to pulsed dosing.  Bonita QuinLinda feels this would be the best option.  Called spoke with patient and discussed the above with her.  Pt stated that she actually does have a regulator for her current portable O2 that will convert to pulsed dosing (she have misunderstood what Bonita QuinLinda was talking about in a previous conversation) up to 6 or 7 lpm.  She would like try this instead of waiting for the portable concentrator.  Pt is scheduled to come to the office tomorrow morning for a walking oximetry to titrate her on pulsed dose setting.  Nothing further needed at this time.  [facility] once testing is complete.

## 2013-06-04 NOTE — Telephone Encounter (Signed)
LMTCB for [name] 

## 2013-06-04 NOTE — Telephone Encounter (Signed)
You are correct t.  She was having a mild copd exacerbation and antibiotics are part of the treatment.

## 2013-06-04 NOTE — Telephone Encounter (Signed)
Okay to refill? 

## 2013-06-04 NOTE — Telephone Encounter (Signed)
Molly Mcdonald from [employer] returned call. She states she may have solution.

## 2013-06-04 NOTE — Telephone Encounter (Signed)
Patient seen in office 05/30/13 patient picked up scripts for Doxycycline and prednisone please advise as to reason patient was started on ABX I read through notes from recent OV patient is requesting reason for medications. Advised patient that prednisone was for COPD exacerbation most likely. Please advise.

## 2013-06-05 ENCOUNTER — Ambulatory Visit: Payer: Medicare Other

## 2013-06-05 NOTE — Telephone Encounter (Signed)
No., this is not a  Refillable rx.  If still wheezing needs to be seen

## 2013-06-06 ENCOUNTER — Other Ambulatory Visit: Payer: Self-pay | Admitting: *Deleted

## 2013-06-06 MED ORDER — ALENDRONATE SODIUM 70 MG PO TABS: 70.0000 mg | ORAL_TABLET | ORAL | Status: AC

## 2013-06-06 NOTE — Telephone Encounter (Signed)
Paperwork and supporting notes have been faxed to [employer], fax placed in scan box.  Nothing further needed at this time.

## 2013-06-06 NOTE — Telephone Encounter (Signed)
I will foward this message to Morrie Sheldonshley because per triage the pt went to  to be walked and there is a form that needs to be completed adn [facility]ed [facility]. This form has been given to [place]. Carron CurieJennifer Alissandra Geoffroy, CMA

## 2013-06-06 NOTE — Telephone Encounter (Signed)
Patient called for refill on fosamax refill sent as requested.

## 2013-06-15 ENCOUNTER — Other Ambulatory Visit: Payer: Self-pay | Admitting: *Deleted

## 2013-06-15 DIAGNOSIS — E119 Type 2 diabetes mellitus without complications: Secondary | ICD-10-CM

## 2013-06-15 DIAGNOSIS — I10 Essential (primary) hypertension: Secondary | ICD-10-CM

## 2013-06-15 MED ORDER — METFORMIN HCL ER 750 MG PO TB24: 750.0000 mg | ORAL_TABLET | Freq: Two times a day (BID) | ORAL | Status: DC

## 2013-06-15 MED ORDER — LISINOPRIL 20 MG PO TABS: 20.0000 mg | ORAL_TABLET | Freq: Every day | ORAL | Status: DC

## 2013-06-15 NOTE — Progress Notes (Signed)
Patient called in for refill on Metformin and Lisinopril refills sent as requested.

## 2013-06-18 ENCOUNTER — Telehealth: Payer: Self-pay | Admitting: *Deleted

## 2013-06-18 MED ORDER — LORAZEPAM 0.5 MG PO TABS: 0.5000 mg | ORAL_TABLET | Freq: Every day | ORAL | Status: DC

## 2013-06-18 NOTE — Telephone Encounter (Signed)
Patient needs refill on lorazepam for trip she is taking and will be gone for a month, if ok to fill I have printed and placed in red folder.

## 2013-06-25 ENCOUNTER — Telehealth: Payer: Self-pay | Admitting: Pulmonary Disease

## 2013-06-25 DIAGNOSIS — J9610 Chronic respiratory failure, unspecified whether with hypoxia or hypercapnia: Secondary | ICD-10-CM

## 2013-06-25 NOTE — Telephone Encounter (Signed)
Spoke with the pt  She states that she was able to find a POC that goes up to 4lpm continuous flow- [facility]  [facility]  Nothing further needed per pt

## 2013-06-30 LAB — HM DIABETES EYE EXAM

## 2013-07-13 ENCOUNTER — Telehealth: Payer: Self-pay | Admitting: Internal Medicine

## 2013-07-13 MED ORDER — PRAMIPEXOLE DIHYDROCHLORIDE 1.5 MG PO TABS: 1.5000 mg | ORAL_TABLET | Freq: Two times a day (BID) | ORAL | Status: DC | PRN

## 2013-07-13 NOTE — Telephone Encounter (Signed)
Pramipexole script needed for restless legs.  [place] is in [place] until 5/29.  Asking if we can fax to [phone] [facility], [place], [place]

## 2013-07-13 NOTE — Telephone Encounter (Signed)
Refill entered. And printed for signature please advise ok to fill?

## 2013-07-16 NOTE — Telephone Encounter (Signed)
Script faxed.

## 2013-07-24 ENCOUNTER — Telehealth: Payer: Self-pay | Admitting: Pulmonary Disease

## 2013-07-24 NOTE — Telephone Encounter (Signed)
Letter typed-printed-signed and faxed to # [phone]  Pt aware.  Nothing further needed.

## 2013-07-24 NOTE — Telephone Encounter (Signed)
OK  Please compose a letter on our letterhead and include the flight information.  To Whom It May Concern:  Please note that my patient Molly Mcdonald has very severe COPD and requires 4 L of oxygen continuously (24 hours a day).  She currently does not have oxygen with her in [place] so unless it can be provided by the airline she will not be able to fly home.  Sincerely,   Dr. Shirley Muscat MD

## 2013-07-24 NOTE — Telephone Encounter (Signed)
Spoke with Patient sisterHarriett Sine. Patient flew to see sister in [place] the in-flight she ran out of oxygen and panicked on the plane.  Flight home cancelled with [employer] d/t patient not having Oxygen to get home. Letter needs to be written stating that patient cannot fly without oxygen so that they can be refunded the money for the flight and are not charged with cancellation fees. Confirmation # [id number] needs to at the top of letter which is the patient ID # with the flight. Flight leaves tomorrow at 1:30---letter needs to be faxed to [employer] before tomorrow.  Fax # [phone]  Please advise Dr Kendrick Fries. Thanks.

## 2013-08-17 LAB — FECAL OCCULT BLOOD, GUAIAC: Fecal Occult Blood: NEGATIVE

## 2013-08-30 ENCOUNTER — Encounter: Payer: Self-pay | Admitting: Internal Medicine

## 2013-08-30 ENCOUNTER — Ambulatory Visit (INDEPENDENT_AMBULATORY_CARE_PROVIDER_SITE_OTHER): Payer: Medicare Other | Admitting: Internal Medicine

## 2013-08-30 VITALS — BP 132/88 | HR 96 | Resp 18 | Ht 61.0 in | Wt 208.8 lb

## 2013-08-30 DIAGNOSIS — E785 Hyperlipidemia, unspecified: Secondary | ICD-10-CM

## 2013-08-30 DIAGNOSIS — F32A Depression, unspecified: Secondary | ICD-10-CM

## 2013-08-30 DIAGNOSIS — H6120 Impacted cerumen, unspecified ear: Secondary | ICD-10-CM

## 2013-08-30 DIAGNOSIS — F3289 Other specified depressive episodes: Secondary | ICD-10-CM

## 2013-08-30 DIAGNOSIS — E119 Type 2 diabetes mellitus without complications: Secondary | ICD-10-CM

## 2013-08-30 DIAGNOSIS — H6121 Impacted cerumen, right ear: Secondary | ICD-10-CM

## 2013-08-30 DIAGNOSIS — F329 Major depressive disorder, single episode, unspecified: Secondary | ICD-10-CM

## 2013-08-30 DIAGNOSIS — H8110 Benign paroxysmal vertigo, unspecified ear: Secondary | ICD-10-CM

## 2013-08-30 LAB — COMPREHENSIVE METABOLIC PANEL
ALT: 27 U/L (ref 0–35)
AST: 25 U/L (ref 0–37)
Albumin: 4.1 g/dL (ref 3.5–5.2)
Alkaline Phosphatase: 59 U/L (ref 39–117)
BILIRUBIN TOTAL: 0.6 mg/dL (ref 0.2–1.2)
BUN: 20 mg/dL (ref 6–23)
CO2: 31 meq/L (ref 19–32)
CREATININE: 0.9 mg/dL (ref 0.4–1.2)
Calcium: 9.8 mg/dL (ref 8.4–10.5)
Chloride: 97 mEq/L (ref 96–112)
GFR: 66.04 mL/min (ref >60.00)
GLUCOSE: 147 mg/dL — AB (ref 70–99)
Potassium: 4.6 mEq/L (ref 3.5–5.1)
Sodium: 136 mEq/L (ref 135–145)
Total Protein: 7.9 g/dL (ref 6.0–8.3)

## 2013-08-30 LAB — LIPID PANEL
Cholesterol: 186 mg/dL (ref 0–200)
HDL: 84.3 mg/dL (ref >39.00)
LDL Cholesterol: 77 mg/dL (ref 0–99)
NonHDL: 101.7
TRIGLYCERIDES: 124 mg/dL (ref 0.0–149.0)
Total CHOL/HDL Ratio: 2
VLDL: 24.8 mg/dL (ref 0.0–40.0)

## 2013-08-30 LAB — HEMOGLOBIN A1C: HEMOGLOBIN A1C: 7.6 % — AB (ref 4.6–6.5)

## 2013-08-30 LAB — HM DIABETES FOOT EXAM: HM Diabetic Foot Exam: NORMAL

## 2013-08-30 MED ORDER — DOCUSATE SODIUM 50 MG/5ML PO LIQD: ORAL | Status: DC

## 2013-08-30 MED ORDER — MECLIZINE HCL 25 MG PO TABS: 25.0000 mg | ORAL_TABLET | Freq: Three times a day (TID) | ORAL | Status: DC | PRN

## 2013-08-30 NOTE — Patient Instructions (Signed)
You need to drink a minimum of 3 16 ounce servings of noncaffeinated beverage daily to stay hydrated  Try G2,  Or Crystal light ok as well   Practice pursed lip breathing when you are at rest  To reduce your barrel chest /chest tightness  You can use the Colace liquid drops in your right ear to soften up the ear wax  You can try taking Meclizine for vertigo one tablet every 8 hours as needed   Labs today

## 2013-08-30 NOTE — Progress Notes (Signed)
Patient ID: Molly Mcdonald, female   DOB: 1945-09-07, 68 y.o.   MRN: [id number]  Patient Active Problem List   Diagnosis Date Noted  . Benign paroxysmal positional vertigo 09/02/2013  . Essential hypertension, benign 06/01/2013  . Other and unspecified hyperlipidemia 06/01/2013  . Type II or unspecified type diabetes mellitus without mention of complication, not stated as uncontrolled 06/01/2013  . Depression 05/02/2013  . COPD: GOLD GRADE D 04/04/2013  . Chronic respiratory failure 04/04/2013    Subjective:  CC:   Chief Complaint  Patient presents with  . Follow-up    3 month followup DM    HPI:   Molly Mcdonald is a 68 y.o. female who presents for Follow up on chronic conditions including Type 2 DM,  COPD advanced, 02 requiring, depression and hypertension.  Accompanied by her son today.    Cc:  Feeling dizzy with position changes.  Occurs  when she bends over even while sitting. Does report that the room is spinning .  Right ear bothering her, some tenderness behind the ear  Productive cough , light yellow sputum,  No fevers or incresed dyspnea.  Breathing and sputum production is at baseline.   Pursed lip breathing taught today   Dm follow up:  Her blood sugars on metformin alone are usually 130 (use splenda and half n half ) never above 140 .  No diarrhea  Depression:  She was taking wellbutrin only once daily in AM until family noticed the incorrect dosing when they noticed abrupt personality changes every afternoon. She has added the secnd dose in early afternoon and feels much better now.     Past Medical History  Diagnosis Date  . Diabetes mellitus   . Restless leg   . Hypertension   . COPD (chronic obstructive pulmonary disease)   . Depression   . Chicken pox   . Shingles   . Hyperlipidemia     Past Surgical History  Procedure Laterality Date  . Breast lumpectomy         The following portions of the patient's history were reviewed and updated as  appropriate: Allergies, current medications, and problem list.    Review of Systems:   Patient denies headache, fevers, malaise, unintentional weight loss, skin rash, eye pain, sinus congestion and sinus pain, sore throat, dysphagia,  hemoptysis , cough, dyspnea, wheezing, chest pain, palpitations, orthopnea, edema, abdominal pain, nausea, melena, diarrhea, constipation, flank pain, dysuria, hematuria, urinary  Frequency, nocturia, numbness, tingling, seizures,  Focal weakness, Loss of consciousness,  Tremor, insomnia, depression, anxiety, and suicidal ideation.     History   Social History  . Marital Status: Widowed    Spouse Name: N/A    Number of Children: N/A  . Years of Education: N/A   Occupational History  . Not on file.   Social History Main Topics  . Smoking status: Former Smoker -- 1.00 packs/day for 46 years    Types: Cigarettes    Quit date: 02/23/2013  . Smokeless tobacco: Never Used  . Alcohol Use: No  . Drug Use: No  . Sexual Activity: Not on file   Other Topics Concern  . Not on file   Social History Narrative  . No narrative on file    Objective:  Filed Vitals:   08/30/13 0920  BP: 132/88  Pulse: 96  Resp: 18     General appearance: alert, cooperative and appears stated age Ears: normal TM's and external ear canals both ears Throat: lips, mucosa,  and tongue normal; teeth and gums normal Neck: no adenopathy, no carotid bruit, supple, symmetrical, trachea midline and thyroid not enlarged, symmetric, no tenderness/mass/nodules Back: symmetric, no curvature. ROM normal. No CVA tenderness. Lungs: clear to auscultation bilaterally Heart: regular rate and rhythm, S1, S2 normal, no murmur, click, rub or gallop Abdomen: soft, non-tender; bowel sounds normal; no masses,  no organomegaly Pulses: 2+ and symmetric Skin: Skin color, texture, turgor normal. No rashes or lesions Lymph nodes: Cervical, supraclavicular, and axillary nodes normal. Neuro: CNs  2-12 intact. DTRs 2+/4 in biceps, brachioradialis, patellars and achilles. Muscle strength 5/5 in upper and lower exremities. Fine resting tremor bilaterally both hands cerebellar function normal. Romberg negative.  No pronator drift.   Gait normal.   Assessment and Plan:  Type II or unspecified type diabetes mellitus without mention of complication, not stated as uncontrolled Managed currently with metformin xr 1500 mg daily,  ACE inhibitor and statin . a1c has risen to 7.6 in the last 3 months.  Will add glipizide 2.5 mg bid. Referral for overdue  diabetic eye exam given..  Foot exam done today and she is requesting referral to podiatry.   Meds reviewed and she is not taking a baby aspirin daily., but does take a statin and an ACE inhibitor.  Urine tested for protein.   Lab Results  Component Value Date   HGBA1C 7.6* 08/30/2013   Lab Results  Component Value Date   MICROALBUR 16.2* 05/30/2013       Benign paroxysmal positional vertigo Aggravated by mild dehydration.  Water intake addressed.  Prn meclizine. Neuro exam normal  Depression Improved with addition of twice daily  wellbutrin to lexapro.,  No changes today   Other and unspecified hyperlipidemia Well controlled on current statin therapy.   Liver enzymes are normal , no changes today. Lab Results  Component Value Date   CHOL 186 08/30/2013   HDL 84.30 08/30/2013   LDLCALC 77 08/30/2013   TRIG 124.0 08/30/2013   CHOLHDL 2 08/30/2013   Lab Results  Component Value Date   ALT 27 08/30/2013   AST 25 08/30/2013   ALKPHOS 59 08/30/2013   BILITOT 0.6 08/30/2013      Updated Medication List Outpatient Encounter Prescriptions as of 08/30/2013  Medication Sig  . alendronate (FOSAMAX) 70 MG tablet Take 1 tablet (70 mg total) by mouth once a week. Take with a full glass of water on an empty stomach.  . Blood Pressure Monitoring (BLOOD PRESSURE MONITOR AUTOMAT) DEVI Use to check blood pressure twice weekly    401.9  . budesonide-formoterol  (SYMBICORT) 160-4.5 MCG/ACT inhaler Inhale 2 puffs into the lungs 2 (two) times daily.    Marland Kitchen. buPROPion (WELLBUTRIN SR) 200 MG 12 hr tablet Take 1 tablet (200 mg total) by mouth 2 (two) times daily.  . cholecalciferol (VITAMIN D) 1000 UNITS tablet Take 1,000 Units by mouth every morning.    . escitalopram (LEXAPRO) 20 MG tablet Take 20 mg by mouth daily.  Marland Kitchen. levalbuterol (XOPENEX) 0.31 MG/3ML nebulizer solution Take 1 ampule by nebulization every 4 (four) hours as needed for wheezing.  Marland Kitchen. lisinopril (PRINIVIL,ZESTRIL) 20 MG tablet Take 1 tablet (20 mg total) by mouth daily.  Marland Kitchen. LORazepam (ATIVAN) 0.5 MG tablet Take 1 tablet (0.5 mg total) by mouth at bedtime. **May take up to three times daily as needed for upset/anxiety**  . lovastatin (MEVACOR) 10 MG tablet Take 10 mg by mouth at bedtime.    . metFORMIN (GLUCOPHAGE-XR) 750 MG 24 hr tablet  Take 1 tablet (750 mg total) by mouth 2 (two) times daily.  . pramipexole (MIRAPEX) 1.5 MG tablet Take 1 tablet (1.5 mg total) by mouth 2 (two) times daily as needed.  . tiotropium (SPIRIVA HANDIHALER) 18 MCG inhalation capsule Place 1 capsule (18 mcg total) into inhaler and inhale daily.  Marland Kitchen. docusate (COLACE) 50 MG/5ML liquid 3 or 4 drops in right ear as needed to soften earwax  . meclizine (ANTIVERT) 25 MG tablet Take 1 tablet (25 mg total) by mouth 3 (three) times daily as needed for dizziness.  . [DISCONTINUED] predniSONE (STERAPRED UNI-PAK) 10 MG tablet 6 tablets on Day 1 , then reduce by 1 tablet daily until gone     Orders Placed This Encounter  Procedures  . HM MAMMOGRAPHY  . Comprehensive metabolic panel  . Hemoglobin A1c  . Lipid panel  . Ambulatory referral to Ophthalmology  . Ambulatory referral to Podiatry  . HM DIABETES EYE EXAM  . HM DIABETES FOOT EXAM    No Follow-up on file.

## 2013-08-30 NOTE — Progress Notes (Signed)
Pre visit review using our clinic review tool, if applicable. No additional management support is needed unless otherwise documented below in the visit note. 

## 2013-09-02 ENCOUNTER — Encounter: Payer: Self-pay | Admitting: Internal Medicine

## 2013-09-02 DIAGNOSIS — H8110 Benign paroxysmal vertigo, unspecified ear: Secondary | ICD-10-CM | POA: Insufficient documentation

## 2013-09-02 MED ORDER — GLIPIZIDE 5 MG PO TABS: 2.5000 mg | ORAL_TABLET | Freq: Two times a day (BID) | ORAL | Status: DC

## 2013-09-02 NOTE — Assessment & Plan Note (Signed)
Improved with addition of twice daily  wellbutrin to lexapro.,  No changes today

## 2013-09-02 NOTE — Assessment & Plan Note (Signed)
Aggravated by mild dehydration.  Water intake addressed.  Prn meclizine. Neuro exam normal

## 2013-09-02 NOTE — Assessment & Plan Note (Addendum)
Managed currently with metformin xr 1500 mg daily,  ACE inhibitor and statin . a1c has risen to 7.6 in the last 3 months.  Will add glipizide 2.5 mg bid. Referral for overdue  diabetic eye exam given..  Foot exam done today and she is requesting referral to podiatry.   Meds reviewed and she is not taking a baby aspirin daily., but does take a statin and an ACE inhibitor.  Urine tested for protein.   Lab Results  Component Value Date   HGBA1C 7.6* 08/30/2013   Lab Results  Component Value Date   MICROALBUR 16.2* 05/30/2013

## 2013-09-02 NOTE — Assessment & Plan Note (Signed)
Well controlled on current statin therapy.   Liver enzymes are normal , no changes today. Lab Results  Component Value Date   CHOL 186 08/30/2013   HDL 84.30 08/30/2013   LDLCALC 77 08/30/2013   TRIG 124.0 08/30/2013   CHOLHDL 2 08/30/2013   Lab Results  Component Value Date   ALT 27 08/30/2013   AST 25 08/30/2013   ALKPHOS 59 08/30/2013   BILITOT 0.6 08/30/2013

## 2013-09-19 ENCOUNTER — Encounter: Payer: Self-pay | Admitting: Internal Medicine

## 2013-09-21 ENCOUNTER — Encounter: Payer: Self-pay | Admitting: *Deleted

## 2013-09-21 NOTE — Progress Notes (Signed)
Chart reviewed for DM bundle. Last OV 08/30/13, appt sch 10/17/13. Labs done 08/30/13, within parameters

## 2013-09-24 ENCOUNTER — Other Ambulatory Visit: Payer: Self-pay | Admitting: Internal Medicine

## 2013-09-24 ENCOUNTER — Other Ambulatory Visit: Payer: Self-pay | Admitting: *Deleted

## 2013-09-24 MED ORDER — PRAMIPEXOLE DIHYDROCHLORIDE 1.5 MG PO TABS: 1.5000 mg | ORAL_TABLET | Freq: Two times a day (BID) | ORAL | Status: DC | PRN

## 2013-10-17 ENCOUNTER — Encounter: Payer: Self-pay | Admitting: Internal Medicine

## 2013-10-17 ENCOUNTER — Telehealth: Payer: Self-pay | Admitting: Internal Medicine

## 2013-10-17 ENCOUNTER — Ambulatory Visit (INDEPENDENT_AMBULATORY_CARE_PROVIDER_SITE_OTHER): Payer: Medicare Other | Admitting: Internal Medicine

## 2013-10-17 VITALS — BP 116/58 | HR 94 | Temp 98.4°F | Resp 18 | Ht 61.0 in | Wt 208.8 lb

## 2013-10-17 DIAGNOSIS — Z0000 Encounter for general adult medical examination without abnormal findings: Secondary | ICD-10-CM

## 2013-10-17 DIAGNOSIS — J9610 Chronic respiratory failure, unspecified whether with hypoxia or hypercapnia: Secondary | ICD-10-CM

## 2013-10-17 DIAGNOSIS — I10 Essential (primary) hypertension: Secondary | ICD-10-CM

## 2013-10-17 DIAGNOSIS — Z23 Encounter for immunization: Secondary | ICD-10-CM

## 2013-10-17 DIAGNOSIS — R0902 Hypoxemia: Secondary | ICD-10-CM

## 2013-10-17 DIAGNOSIS — Z1239 Encounter for other screening for malignant neoplasm of breast: Secondary | ICD-10-CM

## 2013-10-17 DIAGNOSIS — F329 Major depressive disorder, single episode, unspecified: Secondary | ICD-10-CM

## 2013-10-17 DIAGNOSIS — F32A Depression, unspecified: Secondary | ICD-10-CM

## 2013-10-17 DIAGNOSIS — F3289 Other specified depressive episodes: Secondary | ICD-10-CM

## 2013-10-17 DIAGNOSIS — J9611 Chronic respiratory failure with hypoxia: Secondary | ICD-10-CM

## 2013-10-17 DIAGNOSIS — Z13820 Encounter for screening for osteoporosis: Secondary | ICD-10-CM

## 2013-10-17 MED ORDER — MIRTAZAPINE 15 MG PO TABS: 15.0000 mg | ORAL_TABLET | Freq: Every day | ORAL | Status: DC

## 2013-10-17 MED ORDER — LISINOPRIL 20 MG PO TABS: 10.0000 mg | ORAL_TABLET | Freq: Every day | ORAL | Status: DC

## 2013-10-17 NOTE — Telephone Encounter (Signed)
Relevant patient education assigned to patient using Emmi. ° °

## 2013-10-17 NOTE — Progress Notes (Signed)
Patient ID: Molly Mcdonald, female   DOB: Sep 08, 1945, 68 y.o.   MRN: [id number]   The patient is here for annual Medicare wellness examination and management of other chronic and acute problems.  Her recent flight to [place] to visit her sister was miserable bc she forgot to bring her Mirapex for RLS.  She also neglected to bring a letter from pulmonologist certifying requirement of supplemental oxygen  02 for use on the plane, and they wouldn't allow her to take liquid oxygen.  During the flight she became tremulous and t very dyspneic and had to have the flight met by an ambulance upon landing.   .    Had a fall about 3 months ago in her dining room while walking to her  bedroom .  She states that she   lost her balance when she stood up suddenly and became light headed.  She lives with family .  No head injury or hip/arm pain. Uses a walker most places.     The risk factors are reflected in the social history.  The roster of all physicians providing medical care to patient - is listed in the Snapshot section of the chart.  Activities of daily living:  The patient is 100% independent in all ADLs: dressing, toileting, feeding as well as independent mobility  Home safety : The patient has smoke detectors in the home. They wear seatbelts.  There are no firearms at home. There is no violence in the home.   There is no risks for hepatitis, STDs or HIV. There is no   history of blood transfusion. They have no travel history to infectious disease endemic areas of the world.  The patient has seen their dentist in the last six month. They have seen their eye doctor in the last year. They admit to slight hearing difficulty with regard to whispered voices and some television programs.  They have deferred audiologic testing in the last year.  They do not  have excessive sun exposure. Discussed the need for sun protection: hats, long sleeves and use of sunscreen if there is significant sun exposure.   Diet:  the importance of a healthy diet is discussed. They do have a healthy diet.  The benefits of regular aerobic exercise were discussed. She walks 4 times per week ,  20 minutes.   Depression screen: there are multiple  signs and vegative symptoms of depression- irritability, change in appetite, anhedonia, sadness/tearfullness.  Cognitive assessment: the patient manages all their financial and personal affairs and is actively engaged. They could relate day,date,year and events; recalled 2/3 objects at 3 minutes; performed clock-face test normally.  The following portions of the patient's history were reviewed and updated as appropriate: allergies, current medications, past family history, past medical history,  past surgical history, past social history  and problem list.  Visual acuity was not assessed per patient preference since she has regular follow up with her ophthalmologist. Hearing and body mass index were assessed and reviewed.   During the course of the visit the patient was educated and counseled about appropriate screening and preventive services including : fall prevention , diabetes screening, nutrition counseling, colorectal cancer screening, and recommended immunizations.    Objective:  BP 116/58  Pulse 94  Temp(Src) 98.4 F (36.9 C) (Oral)  Resp 18  Ht _0  (1.549 m)  Wt 208 lb 12 oz (94.688 kg)  BMI 39.46 kg/m2  SpO2 88%  General appearance: alert, cooperative and appears much older than  her stated age Ears: normal TM's and external ear canals both ears Throat: lips, mucosa, and tongue normal; teeth and gums normal Neck: no adenopathy, no carotid bruit, supple, symmetrical, trachea midline and thyroid not enlarged, symmetric, no tenderness/mass/nodules Back: symmetric, no curvature. ROM normal. No CVA tenderness. Lungs: clear to auscultation bilaterally Heart: regular rate and rhythm, S1, S2 normal, no murmur, click, rub or gallop Abdomen: soft, non-tender; bowel  sounds normal; no masses,  no organomegaly Pulses: 2+ and symmetric Skin: Skin color, texture, turgor normal. No rashes or lesions Lymph nodes: Cervical, supraclavicular, and axillary nodes normal. Foot exam:  Nails are well trimmed,  No callouses,  Sensation intact to microfilament   Assessment and Plan:   Depression Her daytime activities have been limited by her chronic illness and she reports very little enjoyment in life due to fatigue .  We discussed role of antidepressant and I recommended remeron for multiple reasons.  Starting dsoe 15 mg .. Return in one month   Chronic respiratory failure Secondary to advanced COPD . She is asymptomatic except with exertion  .  She is no longer smoking   Medicare annual wellness visit, subsequent Annual Medicare wellness  exam was done as well as a comprehensive physical exam and management of acute and chronic conditions .  During the course of the visit the patient was educated and counseled about appropriate screening and preventive services including : fall prevention , diabetes screening, nutrition counseling, colorectal cancer screening, and recommended immunizations.  Printed recommendations for health maintenance screenings was given.   Essential hypertension, benign I have reduced her lisinopril dose today given her recent episode of orthostasis resulting in a fall.    Updated Medication List Outpatient Encounter Prescriptions as of 10/17/2013  Medication Sig  . alendronate (FOSAMAX) 70 MG tablet Take 1 tablet (70 mg total) by mouth once a week. Take with a full glass of water on an empty stomach.  . Blood Pressure Monitoring (BLOOD PRESSURE MONITOR AUTOMAT) DEVI Use to check blood pressure twice weekly    401.9  . budesonide-formoterol (SYMBICORT) 160-4.5 MCG/ACT inhaler Inhale 2 puffs into the lungs 2 (two) times daily.    Marland Kitchen buPROPion (WELLBUTRIN SR) 200 MG 12 hr tablet Take 1 tablet (200 mg total) by mouth 2 (two) times daily.  .  cholecalciferol (VITAMIN D) 1000 UNITS tablet Take 1,000 Units by mouth every morning.    . docusate (COLACE) 50 MG/5ML liquid 3 or 4 drops in right ear as needed to soften earwax  . escitalopram (LEXAPRO) 20 MG tablet Take 20 mg by mouth daily.  Marland Kitchen glipiZIDE (GLUCOTROL) 5 MG tablet Take 0.5 tablets (2.5 mg total) by mouth 2 (two) times daily before a meal.  . levalbuterol (XOPENEX) 0.31 MG/3ML nebulizer solution Take 1 ampule by nebulization every 4 (four) hours as needed for wheezing.  Marland Kitchen lisinopril (PRINIVIL,ZESTRIL) 20 MG tablet Take 0.5 tablets (10 mg total) by mouth daily.  Marland Kitchen LORazepam (ATIVAN) 0.5 MG tablet Take 1 tablet (0.5 mg total) by mouth at bedtime. **May take up to three times daily as needed for upset/anxiety**  . lovastatin (MEVACOR) 10 MG tablet Take 10 mg by mouth at bedtime.    . meclizine (ANTIVERT) 25 MG tablet Take 1 tablet (25 mg total) by mouth 3 (three) times daily as needed for dizziness.  . metFORMIN (GLUCOPHAGE-XR) 750 MG 24 hr tablet TAKE (1) TABLET TWICE A DAY.  . mirtazapine (REMERON) 15 MG tablet Take 1 tablet (15 mg total) by mouth at  bedtime.  . pramipexole (MIRAPEX) 1.5 MG tablet TAKE (1) TABLET TWICE A DAY AS NEEDED.  Marland Kitchen pramipexole (MIRAPEX) 1.5 MG tablet Take 1 tablet (1.5 mg total) by mouth 2 (two) times daily as needed.  . tiotropium (SPIRIVA HANDIHALER) 18 MCG inhalation capsule Place 1 capsule (18 mcg total) into inhaler and inhale daily.  . [DISCONTINUED] lisinopril (PRINIVIL,ZESTRIL) 20 MG tablet Take 1 tablet (20 mg total) by mouth daily.

## 2013-10-17 NOTE — Patient Instructions (Addendum)
You may resume a daily baby aspirin 8 1 mg daily  We are reducing your lisinopril to 10 mg daily bc your blood pressure may be too low at home  If the dizziness does not resolve with this change, please call for PT evaluation to work with balance   I am adding mirtazipine 15 mg at bedtime to help your depression.  We may increase the dose after 2 weeks if it is making you too sleepy  You are not due for labs until after October 2.  i will see you in 6 weeks then (2nd week of October)   You had your annual Medicare wellness exam today  We will schedule your mammogram soon.

## 2013-10-17 NOTE — Progress Notes (Signed)
Pre-visit discussion using our clinic review tool. No additional management support is needed unless otherwise documented below in the visit note.  

## 2013-10-19 DIAGNOSIS — Z0000 Encounter for general adult medical examination without abnormal findings: Secondary | ICD-10-CM | POA: Insufficient documentation

## 2013-10-19 NOTE — Assessment & Plan Note (Signed)
Secondary to advanced COPD . She is asymptomatic except with exertion  .  She is no longer smoking

## 2013-10-19 NOTE — Assessment & Plan Note (Signed)
I have reduced her lisinopril dose today given her recent episode of orthostasis resulting in a fall.

## 2013-10-19 NOTE — Assessment & Plan Note (Signed)

## 2013-10-19 NOTE — Assessment & Plan Note (Signed)
Her daytime activities have been limited by her chronic illness and she reports very little enjoyment in life due to fatigue .  We discussed role of antidepressant and I recommended remeron for multiple reasons.  Starting dsoe 15 mg .. Return in one month

## 2013-10-26 ENCOUNTER — Telehealth: Payer: Self-pay | Admitting: *Deleted

## 2013-10-26 NOTE — Telephone Encounter (Signed)
Fax from pharmacy requesting Escitalopram  tab refill.  Last OV 8.19.15.  Please advise refill

## 2013-10-27 NOTE — Telephone Encounter (Signed)
Do not refill escitalopram.  meds were changed last visit.

## 2013-11-09 ENCOUNTER — Telehealth: Payer: Self-pay | Admitting: Internal Medicine

## 2013-11-09 MED ORDER — GLUCOSE BLOOD VI STRP: ORAL_STRIP | Status: AC

## 2013-11-09 MED ORDER — SOLUS V2 BLOOD GLUCOSE SYSTEM DEVI: Status: AC

## 2013-11-09 NOTE — Telephone Encounter (Signed)
Pt needs rx for for talking meter and test supplies. Please call pt and advise/msn

## 2013-11-09 NOTE — Telephone Encounter (Signed)
Rx sent to pharmacy by escript  

## 2013-11-09 NOTE — Telephone Encounter (Signed)
Spoke with pt, brand is Solus V2 talking meter. Pt needs due to unable to see numbers on meter. Spoke with her insurance, states it needs to indicate that it is a necessity for her.

## 2013-11-12 ENCOUNTER — Other Ambulatory Visit: Payer: Self-pay | Admitting: *Deleted

## 2013-11-12 ENCOUNTER — Other Ambulatory Visit: Payer: Self-pay | Admitting: Internal Medicine

## 2013-11-12 ENCOUNTER — Telehealth: Payer: Self-pay | Admitting: Internal Medicine

## 2013-11-12 DIAGNOSIS — F329 Major depressive disorder, single episode, unspecified: Secondary | ICD-10-CM

## 2013-11-12 DIAGNOSIS — F32A Depression, unspecified: Secondary | ICD-10-CM

## 2013-11-12 MED ORDER — GLIPIZIDE 5 MG PO TABS: 2.5000 mg | ORAL_TABLET | Freq: Two times a day (BID) | ORAL | Status: DC

## 2013-11-12 MED ORDER — PRAMIPEXOLE DIHYDROCHLORIDE 1.5 MG PO TABS: ORAL_TABLET | ORAL | Status: AC

## 2013-11-12 MED ORDER — BUPROPION HCL ER (SR) 200 MG PO TB12: 200.0000 mg | ORAL_TABLET | Freq: Two times a day (BID) | ORAL | Status: DC

## 2013-11-12 MED ORDER — METFORMIN HCL ER 750 MG PO TB24: ORAL_TABLET | ORAL | Status: DC

## 2013-11-12 MED ORDER — LOVASTATIN 10 MG PO TABS: 10.0000 mg | ORAL_TABLET | Freq: Every day | ORAL | Status: DC

## 2013-11-12 MED ORDER — MIRTAZAPINE 15 MG PO TABS: 15.0000 mg | ORAL_TABLET | Freq: Every day | ORAL | Status: DC

## 2013-11-12 MED ORDER — TIOTROPIUM BROMIDE MONOHYDRATE 18 MCG IN CAPS: 18.0000 ug | ORAL_CAPSULE | Freq: Every day | RESPIRATORY_TRACT | Status: AC

## 2013-11-12 MED ORDER — MECLIZINE HCL 25 MG PO TABS: 25.0000 mg | ORAL_TABLET | Freq: Three times a day (TID) | ORAL | Status: DC | PRN

## 2013-11-12 NOTE — Telephone Encounter (Signed)
No, neither  Medication change would cause leg edema.  Has she gained weight?

## 2013-11-12 NOTE — Addendum Note (Signed)
Addended by: Chandra Batch E on: 11/12/2013 11:09 AM   Modules accepted: Orders

## 2013-11-12 NOTE — Telephone Encounter (Signed)
Since last office visit patient has had bilateral leg edema at last visit mirtazapine was added and lisinopril reduced patient ask if this could be causing edema to legs advised patient to elevate when sitting and to not change any meds until return call.

## 2013-11-12 NOTE — Telephone Encounter (Signed)
Patient has no way of weighing herself because she cannot see the scale patient will weigh in the morning when significant other is home to read scale and call office. FYI

## 2013-11-13 ENCOUNTER — Other Ambulatory Visit: Payer: Self-pay | Admitting: Internal Medicine

## 2013-11-13 DIAGNOSIS — R609 Edema, unspecified: Secondary | ICD-10-CM | POA: Insufficient documentation

## 2013-11-13 MED ORDER — FUROSEMIDE 20 MG PO TABS: 20.0000 mg | ORAL_TABLET | Freq: Every day | ORAL | Status: DC

## 2013-11-13 NOTE — Telephone Encounter (Signed)
Left message for patient to return call to office. 

## 2013-11-13 NOTE — Telephone Encounter (Signed)
Patient weight at 220 this morning last weight on file 208lb please advise.

## 2013-11-13 NOTE — Telephone Encounter (Signed)
Starting furosemide 20 mg daily.  Return in one week for BMET , rx sent to [facility] pharmacy

## 2013-11-13 NOTE — Assessment & Plan Note (Signed)
Starting furosemide 20 mg daily.  Return in one week for BMET

## 2013-11-14 NOTE — Telephone Encounter (Signed)
Patient notified and voiced understanding lab scheduled. 

## 2013-11-16 ENCOUNTER — Other Ambulatory Visit: Payer: Self-pay | Admitting: *Deleted

## 2013-11-16 DIAGNOSIS — I10 Essential (primary) hypertension: Secondary | ICD-10-CM

## 2013-11-16 MED ORDER — MIRTAZAPINE 15 MG PO TABS: 15.0000 mg | ORAL_TABLET | Freq: Every day | ORAL | Status: DC

## 2013-11-16 MED ORDER — BUDESONIDE-FORMOTEROL FUMARATE 160-4.5 MCG/ACT IN AERO: 2.0000 | INHALATION_SPRAY | Freq: Two times a day (BID) | RESPIRATORY_TRACT | Status: AC

## 2013-11-16 MED ORDER — LISINOPRIL 20 MG PO TABS: 10.0000 mg | ORAL_TABLET | Freq: Every day | ORAL | Status: DC

## 2013-11-16 MED ORDER — METFORMIN HCL ER 750 MG PO TB24: ORAL_TABLET | ORAL | Status: DC

## 2013-11-16 MED ORDER — GLIPIZIDE 5 MG PO TABS: 2.5000 mg | ORAL_TABLET | Freq: Two times a day (BID) | ORAL | Status: AC

## 2013-11-16 NOTE — Progress Notes (Signed)
Patient changed pharmacy for long term medication with dr. Darrick Huntsman verbal approval script sent.

## 2013-11-22 ENCOUNTER — Other Ambulatory Visit: Payer: Medicare Other

## 2013-11-23 ENCOUNTER — Other Ambulatory Visit: Payer: Self-pay | Admitting: *Deleted

## 2013-11-23 DIAGNOSIS — F329 Major depressive disorder, single episode, unspecified: Secondary | ICD-10-CM

## 2013-11-23 DIAGNOSIS — F32A Depression, unspecified: Secondary | ICD-10-CM

## 2013-11-23 MED ORDER — FUROSEMIDE 20 MG PO TABS: 20.0000 mg | ORAL_TABLET | Freq: Every day | ORAL | Status: AC

## 2013-11-23 MED ORDER — BUPROPION HCL ER (SR) 200 MG PO TB12: 200.0000 mg | ORAL_TABLET | Freq: Two times a day (BID) | ORAL | Status: AC

## 2013-11-23 MED ORDER — LOVASTATIN 10 MG PO TABS: 10.0000 mg | ORAL_TABLET | Freq: Every day | ORAL | Status: AC

## 2013-11-24 ENCOUNTER — Other Ambulatory Visit: Payer: Self-pay | Admitting: *Deleted

## 2013-11-24 DIAGNOSIS — I10 Essential (primary) hypertension: Secondary | ICD-10-CM

## 2013-11-24 MED ORDER — LISINOPRIL 20 MG PO TABS: 10.0000 mg | ORAL_TABLET | Freq: Every day | ORAL | Status: AC

## 2013-11-30 ENCOUNTER — Ambulatory Visit: Payer: Medicare Other | Admitting: Internal Medicine

## 2013-12-10 ENCOUNTER — Other Ambulatory Visit: Payer: Medicare Other

## 2013-12-12 ENCOUNTER — Ambulatory Visit: Payer: Medicare Other | Admitting: Internal Medicine

## 2014-02-08 ENCOUNTER — Other Ambulatory Visit: Payer: Self-pay | Admitting: Internal Medicine

## 2014-03-08 ENCOUNTER — Telehealth: Payer: Self-pay | Admitting: Pulmonary Disease

## 2014-03-08 NOTE — Telephone Encounter (Signed)
OK by me as long as she is OK waiting that long. Please explain details and offer sooner visit if needed

## 2014-03-08 NOTE — Telephone Encounter (Signed)
Dr. Kendrick FriesMcQuaid a walk from march of last year will not suffice to order a new POC for the pt.  The qualifying walk generally has to be performed within 60 days of ordering the POC.  Are you ok with waiting until 04/04/14 visit to do the qualifying walk for the pt before ordering?  I can call pt's son to explain this to him.   Thanks!

## 2014-03-08 NOTE — Telephone Encounter (Signed)
Called and spoke to pt's son, Molly Mcdonald. Pt is requesting a POC, specifically Inogen. Pt stated she is using 4-5lpm continuous at home and is wanting something more portable when out of the house. Pt was seen last in 04/2013. Appt made with BQ in [place] on 04/04/14.  BQ please advise.

## 2014-03-08 NOTE — Telephone Encounter (Signed)
Spoke with pt son Dia SitterBrian Riccardo, wants to wait until 04/04/14 appt to do qualifying sats. Will call back if need sooner appt. Nothing further needed.

## 2014-03-08 NOTE — Telephone Encounter (Signed)
OK by me 

## 2014-04-04 ENCOUNTER — Ambulatory Visit: Payer: Self-pay | Admitting: Pulmonary Disease

## 2014-04-14 ENCOUNTER — Encounter (HOSPITAL_COMMUNITY): Payer: Self-pay | Admitting: Oncology

## 2014-04-14 ENCOUNTER — Emergency Department (HOSPITAL_COMMUNITY): Payer: Medicare Other

## 2014-04-14 ENCOUNTER — Inpatient Hospital Stay (HOSPITAL_COMMUNITY)
Admission: EM | Admit: 2014-04-14 | Discharge: 2014-04-19 | DRG: 291 | Disposition: A | Discharge status: Skilled Nursing Facility | Payer: Medicare Other | Attending: Family Medicine | Admitting: Family Medicine

## 2014-04-14 DIAGNOSIS — Z803 Family history of malignant neoplasm of breast: Secondary | ICD-10-CM | POA: Diagnosis not present

## 2014-04-14 DIAGNOSIS — E785 Hyperlipidemia, unspecified: Secondary | ICD-10-CM | POA: Diagnosis present

## 2014-04-14 DIAGNOSIS — Z9981 Dependence on supplemental oxygen: Secondary | ICD-10-CM

## 2014-04-14 DIAGNOSIS — I10 Essential (primary) hypertension: Secondary | ICD-10-CM | POA: Diagnosis present

## 2014-04-14 DIAGNOSIS — J9622 Acute and chronic respiratory failure with hypercapnia: Secondary | ICD-10-CM | POA: Diagnosis present

## 2014-04-14 DIAGNOSIS — E872 Acidosis: Secondary | ICD-10-CM | POA: Diagnosis present

## 2014-04-14 DIAGNOSIS — R040 Epistaxis: Secondary | ICD-10-CM | POA: Diagnosis present

## 2014-04-14 DIAGNOSIS — G9340 Encephalopathy, unspecified: Secondary | ICD-10-CM | POA: Diagnosis present

## 2014-04-14 DIAGNOSIS — I509 Heart failure, unspecified: Secondary | ICD-10-CM

## 2014-04-14 DIAGNOSIS — Z87891 Personal history of nicotine dependence: Secondary | ICD-10-CM | POA: Diagnosis not present

## 2014-04-14 DIAGNOSIS — R0602 Shortness of breath: Secondary | ICD-10-CM | POA: Diagnosis present

## 2014-04-14 DIAGNOSIS — W06XXXA Fall from bed, initial encounter: Secondary | ICD-10-CM | POA: Diagnosis present

## 2014-04-14 DIAGNOSIS — J441 Chronic obstructive pulmonary disease with (acute) exacerbation: Secondary | ICD-10-CM | POA: Diagnosis present

## 2014-04-14 DIAGNOSIS — G2581 Restless legs syndrome: Secondary | ICD-10-CM | POA: Diagnosis present

## 2014-04-14 DIAGNOSIS — Y92009 Unspecified place in unspecified non-institutional (private) residence as the place of occurrence of the external cause: Secondary | ICD-10-CM | POA: Diagnosis not present

## 2014-04-14 DIAGNOSIS — E662 Morbid (severe) obesity with alveolar hypoventilation: Secondary | ICD-10-CM | POA: Diagnosis present

## 2014-04-14 DIAGNOSIS — E876 Hypokalemia: Secondary | ICD-10-CM | POA: Diagnosis present

## 2014-04-14 DIAGNOSIS — Z825 Family history of asthma and other chronic lower respiratory diseases: Secondary | ICD-10-CM

## 2014-04-14 DIAGNOSIS — I5031 Acute diastolic (congestive) heart failure: Principal | ICD-10-CM | POA: Diagnosis present

## 2014-04-14 DIAGNOSIS — E119 Type 2 diabetes mellitus without complications: Secondary | ICD-10-CM | POA: Diagnosis present

## 2014-04-14 DIAGNOSIS — F329 Major depressive disorder, single episode, unspecified: Secondary | ICD-10-CM | POA: Diagnosis present

## 2014-04-14 DIAGNOSIS — Z6841 Body Mass Index (BMI) 40.0 and over, adult: Secondary | ICD-10-CM

## 2014-04-14 DIAGNOSIS — R0902 Hypoxemia: Secondary | ICD-10-CM

## 2014-04-14 DIAGNOSIS — F32A Depression, unspecified: Secondary | ICD-10-CM

## 2014-04-14 DIAGNOSIS — J9621 Acute and chronic respiratory failure with hypoxia: Secondary | ICD-10-CM | POA: Diagnosis present

## 2014-04-14 LAB — CBC
HEMATOCRIT: 46.5 % — AB (ref 36.0–46.0)
HEMOGLOBIN: 12.7 g/dL (ref 12.0–15.0)
MCH: 28.3 pg (ref 26.0–34.0)
MCHC: 27.3 g/dL — AB (ref 30.0–36.0)
MCV: 103.6 fL — ABNORMAL HIGH (ref 78.0–100.0)
Platelets: 265 10*3/uL (ref 150–400)
RBC: 4.49 MIL/uL (ref 3.87–5.11)
RDW: 17.4 % — AB (ref 11.5–15.5)
WBC: 10.7 10*3/uL — AB (ref 4.0–10.5)

## 2014-04-14 LAB — BLOOD GAS, ARTERIAL
ACID-BASE EXCESS: 13.7 mmol/L — AB (ref 0.0–2.0)
BICARBONATE: 41.4 meq/L — AB (ref 20.0–24.0)
DRAWN BY: 105551
O2 Content: 6 L/min
O2 SAT: 68.8 %
PCO2 ART: 99.6 mmHg — AB (ref 35.0–45.0)
Patient temperature: 37
TCO2: 38.7 mmol/L (ref 0–100)
pH, Arterial: 7.242 — ABNORMAL LOW (ref 7.350–7.450)
pO2, Arterial: 42.9 mmHg — ABNORMAL LOW (ref 80.0–100.0)

## 2014-04-14 LAB — TROPONIN I: Troponin I: <0.03 ng/mL (ref <0.031)

## 2014-04-14 LAB — MRSA PCR SCREENING: MRSA by PCR: NEGATIVE

## 2014-04-14 LAB — BASIC METABOLIC PANEL
ANION GAP: 4 — AB (ref 5–15)
BUN: 26 mg/dL — ABNORMAL HIGH (ref 6–23)
CHLORIDE: 93 mmol/L — AB (ref 96–112)
CO2: 43 mmol/L (ref 19–32)
CREATININE: 0.85 mg/dL (ref 0.50–1.10)
Calcium: 9 mg/dL (ref 8.4–10.5)
GFR calc non Af Amer: 68 mL/min — ABNORMAL LOW (ref >90)
GFR, EST AFRICAN AMERICAN: 79 mL/min — AB (ref >90)
Glucose, Bld: 248 mg/dL — ABNORMAL HIGH (ref 70–99)
Potassium: 4.7 mmol/L (ref 3.5–5.1)
SODIUM: 140 mmol/L (ref 135–145)

## 2014-04-14 LAB — TSH: TSH: 1.337 u[IU]/mL (ref 0.350–4.500)

## 2014-04-14 LAB — BRAIN NATRIURETIC PEPTIDE: B Natriuretic Peptide: 560 pg/mL — ABNORMAL HIGH (ref 0.0–100.0)

## 2014-04-14 MED ORDER — LISINOPRIL 10 MG PO TABS: 20.0000 mg | ORAL_TABLET | Freq: Every day | ORAL | Status: DC

## 2014-04-14 MED ORDER — FUROSEMIDE 10 MG/ML IJ SOLN
40.0000 mg | Freq: Two times a day (BID) | INTRAMUSCULAR | Status: DC
  Administered 2014-04-14 – 2014-04-17 (×7): 40 mg via INTRAVENOUS
  Filled 2014-04-14 (×7): qty 4

## 2014-04-14 MED ORDER — CETYLPYRIDINIUM CHLORIDE 0.05 % MT LIQD
7.0000 mL | Freq: Two times a day (BID) | OROMUCOSAL | Status: DC
  Administered 2014-04-14 – 2014-04-19 (×8): 7 mL via OROMUCOSAL

## 2014-04-14 MED ORDER — METHYLPREDNISOLONE SODIUM SUCC 125 MG IJ SOLR
125.0000 mg | Freq: Once | INTRAMUSCULAR | Status: AC
  Administered 2014-04-14: 125 mg via INTRAVENOUS
  Filled 2014-04-14: qty 2

## 2014-04-14 MED ORDER — SODIUM CHLORIDE 0.9 % IJ SOLN
3.0000 mL | Freq: Two times a day (BID) | INTRAMUSCULAR | Status: DC
  Administered 2014-04-14 – 2014-04-19 (×8): 3 mL via INTRAVENOUS

## 2014-04-14 MED ORDER — METHYLPREDNISOLONE SODIUM SUCC 125 MG IJ SOLR
60.0000 mg | Freq: Four times a day (QID) | INTRAMUSCULAR | Status: DC
  Administered 2014-04-14 – 2014-04-15 (×4): 60 mg via INTRAVENOUS
  Filled 2014-04-14 (×4): qty 2

## 2014-04-14 MED ORDER — CHLORHEXIDINE GLUCONATE 0.12 % MT SOLN
15.0000 mL | Freq: Two times a day (BID) | OROMUCOSAL | Status: DC
  Administered 2014-04-14 – 2014-04-19 (×11): 15 mL via OROMUCOSAL
  Filled 2014-04-14 (×11): qty 15

## 2014-04-14 MED ORDER — IPRATROPIUM BROMIDE 0.02 % IN SOLN
0.5000 mg | RESPIRATORY_TRACT | Status: DC
  Administered 2014-04-14 – 2014-04-19 (×30): 0.5 mg via RESPIRATORY_TRACT
  Filled 2014-04-14 (×29): qty 2.5

## 2014-04-14 MED ORDER — ACETAMINOPHEN 325 MG PO TABS
650.0000 mg | ORAL_TABLET | ORAL | Status: DC | PRN
  Administered 2014-04-15: 650 mg via ORAL
  Filled 2014-04-14: qty 2

## 2014-04-14 MED ORDER — SODIUM CHLORIDE 0.9 % IJ SOLN: 3.0000 mL | INTRAMUSCULAR | Status: DC | PRN

## 2014-04-14 MED ORDER — BUDESONIDE-FORMOTEROL FUMARATE 160-4.5 MCG/ACT IN AERO
2.0000 | INHALATION_SPRAY | Freq: Two times a day (BID) | RESPIRATORY_TRACT | Status: DC
  Administered 2014-04-16 – 2014-04-19 (×6): 2 via RESPIRATORY_TRACT
  Filled 2014-04-14: qty 6

## 2014-04-14 MED ORDER — IPRATROPIUM-ALBUTEROL 0.5-2.5 (3) MG/3ML IN SOLN: 3.0000 mL | Freq: Once | RESPIRATORY_TRACT | Status: DC

## 2014-04-14 MED ORDER — INSULIN ASPART 100 UNIT/ML ~~LOC~~ SOLN
0.0000 [IU] | SUBCUTANEOUS | Status: DC
  Administered 2014-04-14: 5 [IU] via SUBCUTANEOUS
  Administered 2014-04-15 (×3): 3 [IU] via SUBCUTANEOUS
  Administered 2014-04-15: 5 [IU] via SUBCUTANEOUS
  Administered 2014-04-15 (×2): 3 [IU] via SUBCUTANEOUS
  Administered 2014-04-16: 2 [IU] via SUBCUTANEOUS
  Administered 2014-04-16: 8 [IU] via SUBCUTANEOUS
  Administered 2014-04-16 (×2): 3 [IU] via SUBCUTANEOUS

## 2014-04-14 MED ORDER — ONDANSETRON HCL 4 MG/2ML IJ SOLN: 4.0000 mg | Freq: Four times a day (QID) | INTRAMUSCULAR | Status: DC | PRN

## 2014-04-14 MED ORDER — PRAVASTATIN SODIUM 10 MG PO TABS
10.0000 mg | ORAL_TABLET | Freq: Every day | ORAL | Status: DC
  Administered 2014-04-15 – 2014-04-18 (×4): 10 mg via ORAL
  Filled 2014-04-14 (×4): qty 1

## 2014-04-14 MED ORDER — LEVALBUTEROL HCL 0.63 MG/3ML IN NEBU
0.6300 mg | INHALATION_SOLUTION | RESPIRATORY_TRACT | Status: DC
  Administered 2014-04-14 – 2014-04-19 (×30): 0.63 mg via RESPIRATORY_TRACT
  Filled 2014-04-14 (×29): qty 3

## 2014-04-14 MED ORDER — LEVOFLOXACIN IN D5W 750 MG/150ML IV SOLN
750.0000 mg | INTRAVENOUS | Status: DC
  Administered 2014-04-14 – 2014-04-16 (×3): 750 mg via INTRAVENOUS
  Filled 2014-04-14 (×3): qty 150

## 2014-04-14 MED ORDER — LEVALBUTEROL HCL 0.63 MG/3ML IN NEBU: 0.6300 mg | INHALATION_SOLUTION | RESPIRATORY_TRACT | Status: DC | PRN

## 2014-04-14 MED ORDER — AZITHROMYCIN 500 MG IV SOLR
500.0000 mg | Freq: Once | INTRAVENOUS | Status: DC
  Filled 2014-04-14: qty 500

## 2014-04-14 MED ORDER — GUAIFENESIN ER 600 MG PO TB12
1200.0000 mg | ORAL_TABLET | Freq: Two times a day (BID) | ORAL | Status: DC
  Administered 2014-04-14 – 2014-04-19 (×10): 1200 mg via ORAL
  Filled 2014-04-14 (×10): qty 2

## 2014-04-14 MED ORDER — ENOXAPARIN SODIUM 40 MG/0.4ML ~~LOC~~ SOLN
40.0000 mg | Freq: Every day | SUBCUTANEOUS | Status: DC
  Administered 2014-04-14 – 2014-04-19 (×6): 40 mg via SUBCUTANEOUS
  Filled 2014-04-14 (×6): qty 0.4

## 2014-04-14 MED ORDER — SODIUM CHLORIDE 0.9 % IV SOLN
250.0000 mL | INTRAVENOUS | Status: DC | PRN
Start: 2014-04-14 — End: 2014-04-19

## 2014-04-14 MED ORDER — ALBUTEROL SULFATE (2.5 MG/3ML) 0.083% IN NEBU
INHALATION_SOLUTION | RESPIRATORY_TRACT | Status: AC
  Administered 2014-04-14: 5 mg
  Filled 2014-04-14: qty 6

## 2014-04-14 MED ORDER — ASPIRIN EC 81 MG PO TBEC
81.0000 mg | DELAYED_RELEASE_TABLET | Freq: Every day | ORAL | Status: DC
  Administered 2014-04-15 – 2014-04-19 (×5): 81 mg via ORAL
  Filled 2014-04-14 (×5): qty 1

## 2014-04-14 MED ORDER — DEXTROSE 5 % IV SOLN
1.0000 g | Freq: Once | INTRAVENOUS | Status: AC
  Administered 2014-04-14: 1 g via INTRAVENOUS
  Filled 2014-04-14: qty 10

## 2014-04-14 MED ORDER — NYSTATIN 100000 UNIT/GM EX POWD
Freq: Two times a day (BID) | CUTANEOUS | Status: DC
  Administered 2014-04-14: 1 via TOPICAL
  Administered 2014-04-14 – 2014-04-15 (×3): via TOPICAL
  Administered 2014-04-16: 1 via TOPICAL
  Administered 2014-04-16 – 2014-04-19 (×6): via TOPICAL
  Filled 2014-04-14 (×2): qty 15

## 2014-04-14 NOTE — ED Notes (Signed)
hospitalist at bedside

## 2014-04-14 NOTE — ED Notes (Signed)
EDP ok with portable chest, due to pt's status.

## 2014-04-14 NOTE — ED Notes (Signed)
MD at bedside. 

## 2014-04-14 NOTE — Progress Notes (Signed)
Pt transferred from ED to unit on bipap. Pt is alert and oriented. VS are stable although pt is a little hypotensive. Will continue to monitor.

## 2014-04-14 NOTE — H&P (Signed)
[facility] History and Physical  Molly Mcdonald WJX:[id number] DOB: 1945/07/06 DOA: 04/14/2014  Referring physician: Dr. Adriana Simas, ER physician PCP: Sherlene Shams, MD   Chief Complaint: Shortness of breath  HPI: Molly Mcdonald is a 69 y.o. female with advanced COPD, chronically on 5 L of oxygen. Patient was brought to the emergency room today after she fell out of her bed this morning, hitting her nose and causing epistaxis. Her family noted to be significantly short of breath and they called 911. They report that she has been increasingly short of breath. The last several days as well as having progressively worsening confusion. She has a chronic cough which appears to be unchanged. Did not report any fevers. No vomiting or diarrhea. No chest pain. They've also noticed that her lower extremity edema has began to worsen.  She was evaluated by EMS and noted to be in worsening respiratory failure requiring BiPAP therapy. She was also significantly confused. She was brought to the ER for evaluation where PCO2 was noted to be in the 90s. Chest x-ray indicated possible congestive heart failure. She is being admitted for further treatment.   Review of Systems:  Pertinent positives as per history of present illness, otherwise negative. History is limited since patient is on BiPAP  Past Medical History  Diagnosis Date  . Diabetes mellitus   . Restless leg   . Hypertension   . COPD (chronic obstructive pulmonary disease)   . Depression   . Chicken pox   . Shingles   . Hyperlipidemia    Past Surgical History  Procedure Laterality Date  . Breast lumpectomy     Social History:  reports that she quit smoking about 13 months ago. Her smoking use included Cigarettes. She has a 46 pack-year smoking history. She has never used smokeless tobacco. She reports that she does not drink alcohol or use illicit drugs.  Allergies  Allergen Reactions  . Tramadol Hives    Family History    Problem Relation Age of Onset  . Asthma Maternal Grandfather   . Cancer Mother     breast    Prior to Admission medications   Medication Sig Start Date End Date Taking? Authorizing Provider  alendronate (FOSAMAX) 70 MG tablet Take 1 tablet (70 mg total) by mouth once a week. Take with a full glass of water on an empty stomach. 06/06/13  Yes Sherlene Shams, MD  aspirin EC 81 MG tablet Take 81 mg by mouth daily.   Yes Historical Provider, MD  Blood Glucose Monitoring Suppl (SOLUS V2 BLOOD GLUCOSE SYSTEM) DEVI Use as directed 11/09/13  Yes Sherlene Shams, MD  Blood Pressure Monitoring (BLOOD PRESSURE MONITOR AUTOMAT) DEVI Use to check blood pressure twice weekly    401.9 05/30/13  Yes Sherlene Shams, MD  budesonide-formoterol (SYMBICORT) 160-4.5 MCG/ACT inhaler Inhale 2 puffs into the lungs 2 (two) times daily. 11/16/13  Yes Sherlene Shams, MD  buPROPion (WELLBUTRIN SR) 200 MG 12 hr tablet Take 1 tablet (200 mg total) by mouth 2 (two) times daily. 11/23/13  Yes Sherlene Shams, MD  cholecalciferol (VITAMIN D) 1000 UNITS tablet Take 1,000 Units by mouth every morning.     Yes Historical Provider, MD  furosemide (LASIX) 20 MG tablet Take 1 tablet (20 mg total) by mouth daily. 11/23/13  Yes Sherlene Shams, MD  glipiZIDE (GLUCOTROL) 5 MG tablet Take 0.5 tablets (2.5 mg total) by mouth 2 (two) times daily before a meal. 11/16/13  Yes  Sherlene Shamseresa L Tullo, MD  glucose blood (SOLUS V2 TEST) test strip Check sugar daily 11/09/13  Yes Sherlene Shamseresa L Tullo, MD  levalbuterol (XOPENEX) 0.31 MG/3ML nebulizer solution Take 1 ampule by nebulization every 4 (four) hours as needed for wheezing.   Yes Historical Provider, MD  lisinopril (PRINIVIL,ZESTRIL) 20 MG tablet Take 0.5 tablets (10 mg total) by mouth daily. Patient taking differently: Take 20 mg by mouth daily.  11/24/13  Yes Sherlene Shamseresa L Tullo, MD  lovastatin (MEVACOR) 10 MG tablet TAKE 1 TABLET AT BEDTIME. 02/08/14  Yes Sherlene Shamseresa L Tullo, MD  metFORMIN (GLUCOPHAGE-XR) 500 MG 24 hr  tablet Take 2,000 mg by mouth daily with breakfast.   Yes Historical Provider, MD  metFORMIN (GLUCOPHAGE-XR) 750 MG 24 hr tablet TAKE (1) TABLET TWICE A DAY. 11/16/13  Yes Sherlene Shamseresa L Tullo, MD  pramipexole (MIRAPEX) 1.5 MG tablet TAKE (1) TABLET TWICE A DAY AS NEEDED. Patient taking differently: Take 1.5 mg by mouth 2 (two) times daily as needed (restless legs). Take 1 tablet daily at bedtime, then 1 tablet as needed for restless legs. 11/12/13  Yes Sherlene Shamseresa L Tullo, MD  docusate (COLACE) 50 MG/5ML liquid 3 or 4 drops in right ear as needed to soften earwax Patient not taking: Reported on 04/14/2014 08/30/13   Sherlene Shamseresa L Tullo, MD  LORazepam (ATIVAN) 0.5 MG tablet Take 1 tablet (0.5 mg total) by mouth at bedtime. **May take up to three times daily as needed for upset/anxiety** Patient not taking: Reported on 04/14/2014 06/18/13   Sherlene Shamseresa L Tullo, MD  lovastatin (MEVACOR) 10 MG tablet Take 1 tablet (10 mg total) by mouth at bedtime. Patient not taking: Reported on 04/14/2014 11/23/13   Sherlene Shamseresa L Tullo, MD  meclizine (ANTIVERT) 25 MG tablet Take 1 tablet (25 mg total) by mouth 3 (three) times daily as needed for dizziness. Patient not taking: Reported on 04/14/2014 11/12/13   Sherlene Shamseresa L Tullo, MD  mirtazapine (REMERON) 15 MG tablet Take 1 tablet (15 mg total) by mouth at bedtime. Patient not taking: Reported on 04/14/2014 11/16/13   Sherlene Shamseresa L Tullo, MD  tiotropium (SPIRIVA HANDIHALER) 18 MCG inhalation capsule Place 1 capsule (18 mcg total) into inhaler and inhale daily. Patient not taking: Reported on 04/14/2014 11/12/13 11/12/14  Sherlene Shamseresa L Tullo, MD   Physical Exam: Filed Vitals:   04/14/14 0700 04/14/14 0715 04/14/14 0741 04/14/14 0800  BP: 111/82   111/93  Pulse:   105   Temp:      TempSrc:      Resp: 25   25  SpO2:  92% 86%     Wt Readings from Last 3 Encounters:  10/17/13 94.688 kg (208 lb 12 oz)  08/30/13 94.688 kg (208 lb 12 oz)  05/30/13 90.606 kg (199 lb 12 oz)    General:  Patient wakes up to voice.  She is currently on BiPAP. Eyes: PERRL, normal lids, irises & conjunctiva ENT: grossly normal hearing, lips & tongue Neck: no LAD, masses or thyromegaly Cardiovascular: RRR, no m/r/g. 1+ lower extremity edema bilaterally. Telemetry: SR, no arrhythmias  Respiratory: Crackles at bases with diminished breath sounds. Increased work of breathing. Abdomen: soft, nontender, obese, positive bowel sounds Skin: Venous stasis changes in her lower extremities bilaterally Musculoskeletal: grossly normal tone BUE/BLE Psychiatric: Unable to properly assess due to respiratory issues Neurologic: grossly non-focal.          Labs on Admission:  Basic Metabolic Panel:  Recent Labs Lab 04/14/14 0656  NA 140  K 4.7  CL 93*  CO2 43*  GLUCOSE 248*  BUN 26*  CREATININE 0.85  CALCIUM 9.0   Liver Function Tests: No results for input(s): AST, ALT, ALKPHOS, BILITOT, PROT, ALBUMIN in the last 168 hours. No results for input(s): LIPASE, AMYLASE in the last 168 hours. No results for input(s): AMMONIA in the last 168 hours. CBC:  Recent Labs Lab 04/14/14 0656  WBC 10.7*  HGB 12.7  HCT 46.5*  MCV 103.6*  PLT 265   Cardiac Enzymes:  Recent Labs Lab 04/14/14 0656  TROPONINI <0.03    BNP (last 3 results)  Recent Labs  04/14/14 0656  BNP 560.0*    ProBNP (last 3 results) No results for input(s): PROBNP in the last 8760 hours.  CBG: No results for input(s): GLUCAP in the last 168 hours.  Radiological Exams on Admission: Dg Chest Port 1 View  04/14/2014   CLINICAL DATA:  Status post fall.  Shortness of breath.  EXAM: PORTABLE CHEST - 1 VIEW  COMPARISON:  03/09/2011  FINDINGS: Limited evaluation due to body habitus and technique. Enlarged cardiac and mediastinal contours. Bilateral mid and lower lung interstitial pulmonary opacities. Costophrenic angles are not well visualized.  IMPRESSION: Limited evaluation due to body habitus.  Cardiomegaly.  Bilateral mid lower lung interstitial  pulmonary opacities may represent edema, atypical infection and/or atelectasis.  The costophrenic angles are not well visualized, small bilateral pleural effusions are not excluded.  Recommend repeat two view radiograph when patient clinically able for further evaluation of the pulmonary parenchyma.   Electronically Signed   By: Annia Belt M.D.   On: 04/14/2014 08:01    EKG: Independently reviewed. Sinus tachycardia  Assessment/Plan Active Problems:   Depression   Essential hypertension, benign   Dyslipidemia   Diabetes mellitus   COPD exacerbation   Acute on chronic respiratory failure   Acute CHF   Acute on chronic respiratory failure with hypercapnia   1. Acute on chronic respiratory failure. Likely multifactorial. Related to her underlying COPD, likely has an acute CHF exacerbation, may have an element of obesity hypoventilation based on her body habitus. We'll continue BiPAP for now. Consult pulmonology for assistance. 2. Acute CHF. Check 2-D echocardiogram. Start on intravenous Lasix and monitor intake and output. We'll continue lisinopril. Hold on beta blocker at this time due to significant COPD exacerbation as well as borderline blood pressures. 3. COPD exacerbation. Continue on bronchodilators, IV steroids as well as antibiotics. 4. Diabetes. Hold oral agents and use sliding scale insulin for now 5. Hypertension. Continue lisinopril. 6. Dyslipidemia. Continue statin  Code Status: full code, I have strongly recommended patient to consider DNR status. She wishes to discuss this with her son. DVT Prophylaxis: lovenox Family Communication: discussed with patient and family at the bedside. Her son was not available at this time. Disposition Plan: pending hospital course  Time spent:  [facility] Pager [phone]

## 2014-04-14 NOTE — ED Provider Notes (Signed)
Chest x-ray suggests bilateral lower lobe opacities, questionable pneumonia or edema.  BNP minimally elevated. Will start IV Rocephin, IV Zithromax. Patient resting comfortably on BiPAP.  Admit  Molly HutchingBrian Charrisse Masley, MD 04/14/14 [phone]

## 2014-04-14 NOTE — ED Notes (Signed)
Pt is on bipap, order to cancel x-ray.

## 2014-04-14 NOTE — ED Notes (Signed)
Pulse ox applied to ear to attempt better waveform, pt. Has gel polish on nails.

## 2014-04-14 NOTE — ED Notes (Signed)
RT at bedside.

## 2014-04-14 NOTE — ED Provider Notes (Addendum)
CSN: [id number]     Arrival date & time 04/14/14  0601 History   First MD Initiated Contact with Patient 04/14/14 [phone]     Chief Complaint  Patient presents with  . Shortness of Breath     (Consider location/radiation/quality/duration/timing/severity/associated sxs/prior Treatment) HPI  This is a 69 year old female who fell out of bed this morning just prior to arrival. When she fell she struck her nose and had bleeding from the nares bilaterally. The bleeding has subsequently abated. She denies other injury or pain other than chronic pain in her knees. EMS found her to be short of breath with wheezing. She was given 2 albuterol treatments prior to arrival with improvement in her dyspnea. She states her breathing is somewhat worse than her baseline at the present time. She was noted to be hypoxic on arrival. She denies chest pain. Her caretaker states she is more confused this morning that her baseline. She is on oxygen at home, 5 liters by nasal cannula.  Past Medical History  Diagnosis Date  . Diabetes mellitus   . Restless leg   . Hypertension   . COPD (chronic obstructive pulmonary disease)   . Depression   . Chicken pox   . Shingles   . Hyperlipidemia    Past Surgical History  Procedure Laterality Date  . Breast lumpectomy     Family History  Problem Relation Age of Onset  . Asthma Maternal Grandfather   . Cancer Mother     breast   History  Substance Use Topics  . Smoking status: Former Smoker -- 1.00 packs/day for 46 years    Types: Cigarettes    Quit date: 02/23/2013  . Smokeless tobacco: Never Used  . Alcohol Use: No   OB History    No data available     Review of Systems  All other systems reviewed and are negative.   Allergies  Tramadol  Home Medications   Prior to Admission medications   Medication Sig Start Date End Date Taking? Authorizing Provider  alendronate (FOSAMAX) 70 MG tablet Take 1 tablet (70 mg total) by mouth once a week. Take with a  full glass of water on an empty stomach. 06/06/13  Yes Sherlene Shams, MD  aspirin EC 81 MG tablet Take 81 mg by mouth daily.   Yes Historical Provider, MD  Blood Glucose Monitoring Suppl (SOLUS V2 BLOOD GLUCOSE SYSTEM) DEVI Use as directed 11/09/13  Yes Sherlene Shams, MD  Blood Pressure Monitoring (BLOOD PRESSURE MONITOR AUTOMAT) DEVI Use to check blood pressure twice weekly    401.9 05/30/13  Yes Sherlene Shams, MD  buPROPion (WELLBUTRIN SR) 200 MG 12 hr tablet Take 1 tablet (200 mg total) by mouth 2 (two) times daily. 11/23/13  Yes Sherlene Shams, MD  furosemide (LASIX) 20 MG tablet Take 1 tablet (20 mg total) by mouth daily. 11/23/13  Yes Sherlene Shams, MD  glipiZIDE (GLUCOTROL) 5 MG tablet Take 0.5 tablets (2.5 mg total) by mouth 2 (two) times daily before a meal. 11/16/13  Yes Sherlene Shams, MD  levalbuterol (XOPENEX) 0.31 MG/3ML nebulizer solution Take 1 ampule by nebulization every 4 (four) hours as needed for wheezing.   Yes Historical Provider, MD  lovastatin (MEVACOR) 10 MG tablet TAKE 1 TABLET AT BEDTIME. 02/08/14  Yes Sherlene Shams, MD  metFORMIN (GLUCOPHAGE-XR) 500 MG 24 hr tablet Take 2,000 mg by mouth daily with breakfast.   Yes Historical Provider, MD  budesonide-formoterol (SYMBICORT) 160-4.5 MCG/ACT inhaler Inhale  2 puffs into the lungs 2 (two) times daily. 11/16/13   Sherlene Shamseresa L Tullo, MD  cholecalciferol (VITAMIN D) 1000 UNITS tablet Take 1,000 Units by mouth every morning.      Historical Provider, MD  docusate (COLACE) 50 MG/5ML liquid 3 or 4 drops in right ear as needed to soften earwax 08/30/13   Sherlene Shamseresa L Tullo, MD  escitalopram (LEXAPRO) 20 MG tablet Take 20 mg by mouth daily.    Historical Provider, MD  glucose blood (SOLUS V2 TEST) test strip Check sugar daily 11/09/13   Sherlene Shamseresa L Tullo, MD  lisinopril (PRINIVIL,ZESTRIL) 20 MG tablet Take 0.5 tablets (10 mg total) by mouth daily. Patient taking differently: Take 20 mg by mouth daily.  11/24/13   Sherlene Shamseresa L Tullo, MD  LORazepam  (ATIVAN) 0.5 MG tablet Take 1 tablet (0.5 mg total) by mouth at bedtime. **May take up to three times daily as needed for upset/anxiety** 06/18/13   Sherlene Shamseresa L Tullo, MD  lovastatin (MEVACOR) 10 MG tablet Take 1 tablet (10 mg total) by mouth at bedtime. 11/23/13   Sherlene Shamseresa L Tullo, MD  meclizine (ANTIVERT) 25 MG tablet Take 1 tablet (25 mg total) by mouth 3 (three) times daily as needed for dizziness. 11/12/13   Sherlene Shamseresa L Tullo, MD  metFORMIN (GLUCOPHAGE-XR) 750 MG 24 hr tablet TAKE (1) TABLET TWICE A DAY. 11/16/13   Sherlene Shamseresa L Tullo, MD  mirtazapine (REMERON) 15 MG tablet Take 1 tablet (15 mg total) by mouth at bedtime. 11/16/13   Sherlene Shamseresa L Tullo, MD  pramipexole (MIRAPEX) 1.5 MG tablet Take 1 tablet (1.5 mg total) by mouth 2 (two) times daily as needed. 09/24/13   Sherlene Shamseresa L Tullo, MD  pramipexole (MIRAPEX) 1.5 MG tablet TAKE (1) TABLET TWICE A DAY AS NEEDED. Patient taking differently: Take 1.5 mg by mouth 2 (two) times daily as needed (restless legs). Take 1 tablet daily at bedtime, then 1 tablet as needed for restless legs. 11/12/13   Sherlene Shamseresa L Tullo, MD  tiotropium (SPIRIVA HANDIHALER) 18 MCG inhalation capsule Place 1 capsule (18 mcg total) into inhaler and inhale daily. 11/12/13 11/12/14  Sherlene Shamseresa L Tullo, MD   BP 111/93 mmHg  Pulse 105  Temp(Src) 97.7 F (36.5 C) (Oral)  Resp 25  SpO2 86%   Physical Exam  General: Well-developed, obese female in no acute distress; appearance consistent with age of record HENT: normocephalic; dried blood in nares Eyes: pupils equal, round and reactive to light; extraocular muscles intact Neck: supple Heart: regular rate and rhythm; tachycardia; distant sounds Lungs: Distant sounds; mild tachypnea; pursed-lip expirations Abdomen: soft; nondistended; nontender; bowel sounds present Extremities: No deformity; full range of motion; chronic appearing hyperpigmentation and thickening of skin of lower legs Neurologic: Awake, alert; motor function intact in all extremities and  symmetric; no facial droop Skin: Clammy Psychiatric: Normal mood and affect   ED Course  Procedures (including critical care time)   EKG Interpretation   Date/Time:  Sunday April 14 2014 06:16:31 EST Ventricular Rate:  107 PR Interval:  162 QRS Duration: 93 QT Interval:  346 QTC Calculation: 462 R Axis:   112 Text Interpretation:  Sinus tachycardia Right axis deviation Low voltage,  precordial leads No previous ECGs available Confirmed by Avril Busser  MD, Jonny RuizJOHN  [phone]([id number]) on 04/14/2014 6:24:51 AM      CRITICAL CARE Performed by: Paula LibraMOLPUS,Kimber Fritts L Total critical care time: 35 minutes Critical care time was exclusive of separately billable procedures and treating other patients. Critical care was necessary to treat or prevent  imminent or life-threatening deterioration. Critical care was time spent personally by me on the following activities: development of treatment plan with patient and/or surrogate as well as nursing, discussions with consultants, evaluation of patient's response to treatment, examination of patient, obtaining history from patient or surrogate, ordering and performing treatments and interventions, ordering and review of laboratory studies, ordering and review of radiographic studies, pulse oximetry and re-evaluation of patient's condition.   MDM  Nursing notes and vitals signs, including pulse oximetry, reviewed.  Summary of this visit's results, reviewed by myself:  Labs:  Results for orders placed or performed during the hospital encounter of 04/14/14 (from the past 24 hour(s))  Blood gas, arterial ([facility] & [facility] ONLY)     Status: Abnormal   Collection Time: 04/14/14  6:55 AM  Result Value Ref Range   O2 Content 6.0 L/min   Delivery systems NASAL CANNULA    pH, Arterial 7.242 (L) 7.350 - 7.450   pCO2 arterial 99.6 (HH) 35.0 - 45.0 mmHg   pO2, Arterial 42.9 (L) 80.0 - 100.0 mmHg   Bicarbonate 41.4 (H) 20.0 - 24.0 mEq/L   TCO2 38.7 0 - 100 mmol/L   Acid-Base Excess  13.7 (H) 0.0 - 2.0 mmol/L   O2 Saturation 68.8 %   Patient temperature 37.0    Collection site RADIAL    Drawn by [id number]    Sample type ARTERIAL    Allens test (pass/fail) PASS PASS  Basic metabolic panel    (if pt has PMH of COPD)     Status: Abnormal   Collection Time: 04/14/14  6:56 AM  Result Value Ref Range   Sodium 140 135 - 145 mmol/L   Potassium 4.7 3.5 - 5.1 mmol/L   Chloride 93 (L) 96 - 112 mmol/L   CO2 43 (HH) 19 - 32 mmol/L   Glucose, Bld 248 (H) 70 - 99 mg/dL   BUN 26 (H) 6 - 23 mg/dL   Creatinine, Ser 0.45 0.50 - 1.10 mg/dL   Calcium 9.0 8.4 - 40.9 mg/dL   GFR calc non Af Amer 68 (L) >90 mL/min   GFR calc Af Amer 79 (L) >90 mL/min   Anion gap 4 (L) 5 - 15  CBC     (if pt has PMH of COPD)     Status: Abnormal   Collection Time: 04/14/14  6:56 AM  Result Value Ref Range   WBC 10.7 (H) 4.0 - 10.5 K/uL   RBC 4.49 3.87 - 5.11 MIL/uL   Hemoglobin 12.7 12.0 - 15.0 g/dL   HCT 81.1 (H) 91.4 - 78.2 %   MCV 103.6 (H) 78.0 - 100.0 fL   MCH 28.3 26.0 - 34.0 pg   MCHC 27.3 (L) 30.0 - 36.0 g/dL   RDW 95.6 (H) 21.3 - 08.6 %   Platelets 265 150 - 400 K/uL  Troponin I (if patient has history of COPD)     Status: None   Collection Time: 04/14/14  6:56 AM  Result Value Ref Range   Troponin I <0.03 <0.031 ng/mL  Brain natriuretic peptide     Status: Abnormal   Collection Time: 04/14/14  6:56 AM  Result Value Ref Range   B Natriuretic Peptide 560.0 (H) 0.0 - 100.0 pg/mL   Dg Chest Port 1 View  04/14/2014   CLINICAL DATA:  Status post fall.  Shortness of breath.  EXAM: PORTABLE CHEST - 1 VIEW  COMPARISON:  03/09/2011  FINDINGS: Limited evaluation due to body habitus and technique. Enlarged cardiac  and mediastinal contours. Bilateral mid and lower lung interstitial pulmonary opacities. Costophrenic angles are not well visualized.  IMPRESSION: Limited evaluation due to body habitus.  Cardiomegaly.  Bilateral mid lower lung interstitial pulmonary opacities may represent edema, atypical  infection and/or atelectasis.  The costophrenic angles are not well visualized, small bilateral pleural effusions are not excluded.  Recommend repeat two view radiograph when patient clinically able for further evaluation of the pulmonary parenchyma.   Electronically Signed   By: Annia Belt M.D.   On: 04/14/2014 08:01     7:12 AM Oxygen saturation 94% on nonrebreather. Patient still awake and alert. Given blood gas findings will start her on BiPAP; she is willing to try this, though she has never been on BiPAP in the past.   Hanley Seamen, MD 04/14/14 1610  Hanley Seamen, MD 04/14/14 9604  Hanley Seamen, MD 04/14/14 [phone]

## 2014-04-14 NOTE — ED Notes (Signed)
RT called for Bi pap

## 2014-04-14 NOTE — ED Notes (Signed)
Pt brought in by [facility] for c/o fall; pt fell out of bed and fell on her face, pt has bleeding to nose; pt was also found to be sob; pt given 2 albuterol treatments by [facility] pta cbg 226

## 2014-04-15 ENCOUNTER — Inpatient Hospital Stay (HOSPITAL_COMMUNITY): Payer: Medicare Other

## 2014-04-15 DIAGNOSIS — E785 Hyperlipidemia, unspecified: Secondary | ICD-10-CM

## 2014-04-15 LAB — BLOOD GAS, ARTERIAL
Acid-Base Excess: 21.7 mmol/L — ABNORMAL HIGH (ref 0.0–2.0)
Bicarbonate: 47.3 mEq/L — ABNORMAL HIGH (ref 20.0–24.0)
DELIVERY SYSTEMS: POSITIVE
Drawn by: 317771
Expiratory PAP: 8
FIO2: 0.9 %
INSPIRATORY PAP: 16
O2 SAT: 91.2 %
Patient temperature: 37
TCO2: 42.2 mmol/L (ref 0–100)
pCO2 arterial: 64.3 mmHg (ref 35.0–45.0)
pH, Arterial: 7.48 — ABNORMAL HIGH (ref 7.350–7.450)
pO2, Arterial: 60.4 mmHg — ABNORMAL LOW (ref 80.0–100.0)

## 2014-04-15 LAB — BASIC METABOLIC PANEL
Anion gap: 10 (ref 5–15)
BUN: 26 mg/dL — ABNORMAL HIGH (ref 6–23)
CHLORIDE: 85 mmol/L — AB (ref 96–112)
CO2: 46 mmol/L (ref 19–32)
CREATININE: 0.86 mg/dL (ref 0.50–1.10)
Calcium: 9 mg/dL (ref 8.4–10.5)
GFR calc Af Amer: 78 mL/min — ABNORMAL LOW (ref >90)
GFR calc non Af Amer: 67 mL/min — ABNORMAL LOW (ref >90)
Glucose, Bld: 216 mg/dL — ABNORMAL HIGH (ref 70–99)
Potassium: 4.3 mmol/L (ref 3.5–5.1)
Sodium: 141 mmol/L (ref 135–145)

## 2014-04-15 MED ORDER — MORPHINE SULFATE 2 MG/ML IJ SOLN
2.0000 mg | INTRAMUSCULAR | Status: DC | PRN
  Administered 2014-04-15: 2 mg via INTRAVENOUS
  Filled 2014-04-15: qty 1

## 2014-04-15 MED ORDER — IOHEXOL 350 MG/ML SOLN
100.0000 mL | Freq: Once | INTRAVENOUS | Status: AC | PRN
  Administered 2014-04-15: 100 mL via INTRAVENOUS

## 2014-04-15 MED ORDER — METHYLPREDNISOLONE SODIUM SUCC 125 MG IJ SOLR
125.0000 mg | Freq: Four times a day (QID) | INTRAMUSCULAR | Status: DC
  Administered 2014-04-15 – 2014-04-17 (×8): 125 mg via INTRAVENOUS
  Filled 2014-04-15 (×8): qty 2

## 2014-04-15 NOTE — Progress Notes (Signed)
[facility] PROGRESS NOTE  Molly Mcdonald WUJ:[id number] DOB: 1945/09/04 DOA: 04/14/2014 PCP: Molly Mcdonald, Molly L, MD  Assessment/Plan: 1. Acute on chronic respiratory failure. Multifactorial related to COPD as well as CHF. Patient is continued on BiPAP. Appreciate pulmonology assistance. Plans are to obtain CT of the chest since patient still has significant hypoxia and is requiring 90% oxygen on BiPAP 2. Acute diastolic congestive heart failure. Volume status appears to be improving on clinical exam. We'll continue with diuresis. Hold lisinopril since blood pressures run in the lower side. Echocardiogram has been done with intact ejection fraction. Weights are trending down. 3. COPD exacerbation. Continue bronchodilators, intravenous steroids as well as antibiotics. 4. Diabetes. Sinus insulin. 5. Hypertension. Antihypertensives currently on hold due to borderline blood pressures.  6. Dyslipidemia. Continue statin  Code Status: full code Family Communication: discussed with patient Disposition Plan: pending hospital course, may need placement   Consultants:  Pulmonology  Procedures:    Antibiotics:  levaquin 2/14>>  HPI/Subjective: Feels a little better this morning. Still requiring bipap for hypoxia  Objective: Filed Vitals:   04/15/14 1100  BP: 113/57  Pulse: 99  Temp:   Resp: 21    Intake/Output Summary (Last 24 hours) at 04/15/14 1156 Last data filed at 04/15/14 1100  Gross per 24 hour  Intake    300 ml  Output   5400 ml  Net  -5100 ml   Filed Weights   04/14/14 1042 04/15/14 0500  Weight: 106.7 kg (235 lb 3.7 oz) 102.6 kg (226 lb 3.1 oz)    Exam:   General:  NAD, on bipap  Cardiovascular: s1, s2, rrr  Respiratory: improved breath sounds bilaterally with fewer crackles  Abdomen: soft, nt, nd, bs+  Musculoskeletal: improving LE edema   Data Reviewed: Basic Metabolic Panel:  Recent Labs Lab 04/14/14 0656 04/15/14 0441  NA 140 141  K  4.7 4.3  CL 93* 85*  CO2 43* 46*  GLUCOSE 248* 216*  BUN 26* 26*  CREATININE 0.85 0.86  CALCIUM 9.0 9.0   Liver Function Tests: No results for input(s): AST, ALT, ALKPHOS, BILITOT, PROT, ALBUMIN in the last 168 hours. No results for input(s): LIPASE, AMYLASE in the last 168 hours. No results for input(s): AMMONIA in the last 168 hours. CBC:  Recent Labs Lab 04/14/14 0656  WBC 10.7*  HGB 12.7  HCT 46.5*  MCV 103.6*  PLT 265   Cardiac Enzymes:  Recent Labs Lab 04/14/14 0656 04/14/14 1129 04/14/14 1657 04/14/14 2223  TROPONINI <0.03 <0.03 <0.03 <0.03   BNP (last 3 results)  Recent Labs  04/14/14 0656  BNP 560.0*    ProBNP (last 3 results) No results for input(s): PROBNP in the last 8760 hours.  CBG: No results for input(s): GLUCAP in the last 168 hours.  Recent Results (from the past 240 hour(s))  MRSA PCR Screening     Status: None   Collection Time: 04/14/14 10:20 AM  Result Value Ref Range Status   MRSA by PCR NEGATIVE NEGATIVE Final    Comment:        The GeneXpert MRSA Assay (FDA approved for NASAL specimens only), is one component of a comprehensive MRSA colonization surveillance program. It is not intended to diagnose MRSA infection nor to guide or monitor treatment for MRSA infections.      Studies: Dg Chest Port 1 View  04/14/2014   CLINICAL DATA:  Status post fall.  Shortness of breath.  EXAM: PORTABLE CHEST - 1 VIEW  COMPARISON:  03/09/2011  FINDINGS: Limited evaluation due to body habitus and technique. Enlarged cardiac and mediastinal contours. Bilateral mid and lower lung interstitial pulmonary opacities. Costophrenic angles are not well visualized.  IMPRESSION: Limited evaluation due to body habitus.  Cardiomegaly.  Bilateral mid lower lung interstitial pulmonary opacities may represent edema, atypical infection and/or atelectasis.  The costophrenic angles are not well visualized, small bilateral pleural effusions are not excluded.   Recommend repeat two view radiograph when patient clinically able for further evaluation of the pulmonary parenchyma.   Electronically Signed   By: Annia Belt M.D.   On: 04/14/2014 08:01    Scheduled Meds: . antiseptic oral rinse  7 mL Mouth Rinse q12n4p  . aspirin EC  81 mg Oral Daily  . budesonide-formoterol  2 puff Inhalation BID  . chlorhexidine  15 mL Mouth Rinse BID  . enoxaparin (LOVENOX) injection  40 mg Subcutaneous Daily  . furosemide  40 mg Intravenous BID  . guaiFENesin  1,200 mg Oral BID  . insulin aspart  0-15 Units Subcutaneous 6 times per day  . ipratropium  0.5 mg Nebulization Q4H  . levalbuterol  0.63 mg Nebulization Q4H  . levofloxacin (LEVAQUIN) IV  750 mg Intravenous Q24H  . methylPREDNISolone (SOLU-MEDROL) injection  125 mg Intravenous Q6H  . nystatin   Topical BID  . pravastatin  10 mg Oral q1800  . sodium chloride  3 mL Intravenous Q12H   Continuous Infusions:   Active Problems:   Depression   Essential hypertension, benign   Dyslipidemia   Diabetes mellitus   COPD exacerbation   Acute on chronic respiratory failure   Acute CHF   Acute on chronic respiratory failure with hypercapnia    Time spent:    Molly Mcdonald  [facility] Pager [phone]. If 7PM-7AM, please contact night-coverage at [email or web address], password [email or web address] 04/15/2014, 11:56 AM  LOS: 1 day

## 2014-04-15 NOTE — Progress Notes (Signed)
INITIAL NUTRITION ASSESSMENT  DOCUMENTATION CODES Per approved criteria  -Obesity Unspecified   INTERVENTION: Recommend check A1C%   Will follow-up to provide diet education when she is more able to participate   NUTRITION DIAGNOSIS: Inadequate oral intake related to respiratory failure as evidenced by  NPO status.   Goal: Pt to meet >/= 90% of their estimated nutrition needs    Monitor:  Respiratory status, I/O's,  po intake, labs and wt trends   Reason for Assessment: evaluate nutrition status/ requirements  69 y.o. female  ASSESSMENT: Pt presents with acute respiratory failure, acute CHF (wt gain noted). S/p fall at home. Hx includes DM type 2 . Pt last Hemoglobin A1C 7.6% (high) in July 2015. She also has COPD, HLD (hyperlipidemia) and HTN.  Pt admits she "loves spaghetti" and has been eating fast foods, frozen meals and cereal for breakfast. Her family is concerned that the people who are supposed to be caring for her at home are not doing a very good job.  Pt is able to provide limited information at this time. Will return to discuss ways to improve the quality of her nutrition intake when she is able to talk more easily.  Based on her brief diet hx she's consuming high fat high, high sodium foods and very little vegetables or fresh fruits.  Beverages of choice are coffee and Peak brand sweet tea.   Labs: BUN 26 and glucose 216- high.   Height: Ht Readings from Last 1 Encounters:  04/14/14  (1.549 m)    Weight: Wt Readings from Last 1 Encounters:  04/15/14 226 lb 3.1 oz (102.6 kg)    Ideal Body Weight: 105# (47.7 kg)  % Ideal Body Weight: 215%  Wt Readings from Last 10 Encounters:  04/15/14 226 lb 3.1 oz (102.6 kg)  10/17/13 208 lb 12 oz (94.688 kg)  08/30/13 208 lb 12 oz (94.688 kg)  05/30/13 199 lb 12 oz (90.606 kg)  05/02/13 200 lb (90.719 kg)  04/04/13 196 lb (88.905 kg)  03/09/11 185 lb (83.915 kg)    Usual Body Weight: 200-210#  % Usual  Body Weight: 108%  BMI:  Body mass index is 42.76 kg/(m^2).obesity class III  Estimated Nutritional Needs: Kcal: 1400-1600 Protein: 99-120 gr Fluid: 1.4-1.6 liters daily  Skin: intact  Diet Order: Diet NPO time specified  EDUCATION NEEDS: -Education not appropriate at this time   Intake/Output Summary (Last 24 hours) at 04/15/14 1229 Last data filed at 04/15/14 1100  Gross per 24 hour  Intake    150 ml  Output   5400 ml  Net  -5250 ml    Last BM: 04/12/14   Labs:   Recent Labs Lab 04/14/14 0656 04/15/14 0441  NA 140 141  K 4.7 4.3  CL 93* 85*  CO2 43* 46*  BUN 26* 26*  CREATININE 0.85 0.86  CALCIUM 9.0 9.0  GLUCOSE 248* 216*    CBG (last 3)  No results for input(s): GLUCAP in the last 72 hours.  Scheduled Meds: . antiseptic oral rinse  7 mL Mouth Rinse q12n4p  . aspirin EC  81 mg Oral Daily  . budesonide-formoterol  2 puff Inhalation BID  . chlorhexidine  15 mL Mouth Rinse BID  . enoxaparin (LOVENOX) injection  40 mg Subcutaneous Daily  . furosemide  40 mg Intravenous BID  . guaiFENesin  1,200 mg Oral BID  . insulin aspart  0-15 Units Subcutaneous 6 times per day  . ipratropium  0.5 mg Nebulization Q4H  .  levalbuterol  0.63 mg Nebulization Q4H  . levofloxacin (LEVAQUIN) IV  750 mg Intravenous Q24H  . methylPREDNISolone (SOLU-MEDROL) injection  125 mg Intravenous Q6H  . nystatin   Topical BID  . pravastatin  10 mg Oral q1800  . sodium chloride  3 mL Intravenous Q12H    Continuous Infusions:   Past Medical History  Diagnosis Date  . Diabetes mellitus   . Restless leg   . Hypertension   . COPD (chronic obstructive pulmonary disease)   . Depression   . Chicken pox   . Shingles   . Hyperlipidemia     Past Surgical History  Procedure Laterality Date  . Breast lumpectomy      Royann ShiversLynn Sayvon Arterberry MS,RD,CSG,LDN Office: #[phone] Pager: [phone]

## 2014-04-15 NOTE — Care Management Utilization Note (Signed)
UR completed 

## 2014-04-15 NOTE — Care Management Note (Addendum)
    Page 1 of 1   04/19/2014     12:14:58 PM CARE MANAGEMENT NOTE 04/19/2014  Patient:  Molly Mcdonald,[name]   Account Number:  [id number]  Date Initiated:  04/15/2014  Documentation initiated by:  Anibal HendersonBOLDEN,GENEVA  Subjective/Objective Assessment:   pt admitted with respiratory failure, on BiPAP.  Pt is from home.Will see when respiratory status more stable     Action/Plan:   May need HH VS SNF   Anticipated DC Date:  04/19/2014   Anticipated DC Plan:  SKILLED NURSING FACILITY  In-house referral  Clinical Social Worker      DC Planning Services  CM consult      Choice offered to / List presented to:             Status of service:  Completed, signed off Medicare Important Message given?  YES (If response is "NO", the following Medicare IM given date fields will be blank) Date Medicare IM given:  04/19/2014 Medicare IM given by:  Sharrie RothmanBLACKWELL,Maddox Bratcher C Date Additional Medicare IM given:   Additional Medicare IM given by:    Discharge Disposition:  SKILLED NURSING FACILITY  Per UR Regulation:  Reviewed for med. necessity/level of care/duration of stay  If discussed at Long Length of Stay Meetings, dates discussed:    Comments:  04/19/14 1215 Arlyss Queenammy Hawley Pavia, RN BSN CM [facility] [place]. CSW to arrange discharge to facility.  04/17/2014 1500 Kathyrn SheriffJessica Childress, RN, MSN, CM Pt plans to discharge to SNF per PT's recommendation. CSW is aware and will arrange for placement. Will cont to follow for CM needs. 04/15/14 1630 Anibal HendersonGeneva Bolden RN/CM

## 2014-04-15 NOTE — Consult Note (Addendum)
Consult requested by: [facility] Consult requested for respiratory failure:  HPI: This is a 69 year old who has a long known history of COPD and who had been having increasing shortness of breath for several days. She fell at home suffering an injury to her face which precipitated her coming to the emergency department. In the emergency department she was noted to have hypoxia and was started on BiPAP. She is requiring 90% oxygen on BiPAP now. She looks pretty comfortable and is able to communicate with me. She says her breathing is better. She has been coughing some at home. She denies any chest pain  Past Medical History  Diagnosis Date  . Diabetes mellitus   . Restless leg   . Hypertension   . COPD (chronic obstructive pulmonary disease)   . Depression   . Chicken pox   . Shingles   . Hyperlipidemia      Family History  Problem Relation Age of Onset  . Asthma Maternal Grandfather   . Cancer Mother     breast     History   Social History  . Marital Status: Married    Spouse Name: N/A  . Number of Children: N/A  . Years of Education: N/A   Social History Main Topics  . Smoking status: Former Smoker -- 1.00 packs/day for 46 years    Types: Cigarettes    Quit date: 02/23/2013  . Smokeless tobacco: Never Used  . Alcohol Use: No  . Drug Use: No  . Sexual Activity: Not on file   Other Topics Concern  . None   Social History Narrative     ROS: She says she's been doing pretty well at home. She uses oxygen at home. She denies any chest pain. She's not had hemoptysis. She complains of being hungry. She says she is sore. Otherwise per history and physical    Objective: Vital signs in last 24 hours: Temp:  [97.2 F (36.2 C)-98.5 F (36.9 C)] 98.5 F (36.9 C) (02/15 0400) Pulse Rate:  [82-125] 90 (02/15 0722) Resp:  [0-34] 19 (02/15 0722) BP: (85-157)/(23-128) 89/43 mmHg (02/15 0700) SpO2:  [81 %-99 %] 93 % (02/15 0722) FiO2 (%):  [60 %-90 %] 90 % (02/15  0714) Weight:  [102.6 kg (226 lb 3.1 oz)-106.7 kg (235 lb 3.7 oz)] 102.6 kg (226 lb 3.1 oz) (02/15 0500) Weight change:  Last BM Date: 04/12/14  Intake/Output from previous day: 02/14 0701 - 02/15 0700 In: 150 [IV Piggyback:150] Out: 3700 [Urine:3700]  PHYSICAL EXAM She is awake and alert and on BiPAP. She has ecchymoses around both eyes. Her mucous membranes are moist. Her neck is supple. Her chest shows mild bilateral rhonchi. Her heart is regular without gallop. Her abdomen is soft. Extremities show no edema central nervous system examination is grossly intact  Lab Results: Basic Metabolic Panel:  Recent Labs  16/10/96 0656 04/15/14 0441  NA 140 141  K 4.7 4.3  CL 93* 85*  CO2 43* 46*  GLUCOSE 248* 216*  BUN 26* 26*  CREATININE 0.85 0.86  CALCIUM 9.0 9.0   Liver Function Tests: No results for input(s): AST, ALT, ALKPHOS, BILITOT, PROT, ALBUMIN in the last 72 hours. No results for input(s): LIPASE, AMYLASE in the last 72 hours. No results for input(s): AMMONIA in the last 72 hours. CBC:  Recent Labs  04/14/14 0656  WBC 10.7*  HGB 12.7  HCT 46.5*  MCV 103.6*  PLT 265   Cardiac Enzymes:  Recent Labs  04/14/14 1129  04/14/14 1657 04/14/14 2223  TROPONINI <0.03 <0.03 <0.03   BNP: No results for input(s): PROBNP in the last 72 hours. D-Dimer: No results for input(s): DDIMER in the last 72 hours. CBG: No results for input(s): GLUCAP in the last 72 hours. Hemoglobin A1C: No results for input(s): HGBA1C in the last 72 hours. Fasting Lipid Panel: No results for input(s): CHOL, HDL, LDLCALC, TRIG, CHOLHDL, LDLDIRECT in the last 72 hours. Thyroid Function Tests:  Recent Labs  04/14/14 0655  TSH 1.337   Anemia Panel: No results for input(s): VITAMINB12, FOLATE, FERRITIN, TIBC, IRON, RETICCTPCT in the last 72 hours. Coagulation: No results for input(s): LABPROT, INR in the last 72 hours. Urine Drug Screen: Drugs of Abuse  No results found for: LABOPIA,  COCAINSCRNUR, LABBENZ, AMPHETMU, THCU, LABBARB  Alcohol Level: No results for input(s): ETH in the last 72 hours. Urinalysis: No results for input(s): COLORURINE, LABSPEC, PHURINE, GLUCOSEU, HGBUR, BILIRUBINUR, KETONESUR, PROTEINUR, UROBILINOGEN, NITRITE, LEUKOCYTESUR in the last 72 hours.  Invalid input(s): APPERANCEUR Misc. Labs:   ABGS:  Recent Labs  04/15/14 0500  PHART 7.480*  PO2ART 60.4*  TCO2 42.2  HCO3 47.3*     MICROBIOLOGY: Recent Results (from the past 240 hour(s))  MRSA PCR Screening     Status: None   Collection Time: 04/14/14 10:20 AM  Result Value Ref Range Status   MRSA by PCR NEGATIVE NEGATIVE Final    Comment:        The GeneXpert MRSA Assay (FDA approved for NASAL specimens only), is one component of a comprehensive MRSA colonization surveillance program. It is not intended to diagnose MRSA infection nor to guide or monitor treatment for MRSA infections.     Studies/Results: Dg Chest Port 1 View  04/14/2014   CLINICAL DATA:  Status post fall.  Shortness of breath.  EXAM: PORTABLE CHEST - 1 VIEW  COMPARISON:  03/09/2011  FINDINGS: Limited evaluation due to body habitus and technique. Enlarged cardiac and mediastinal contours. Bilateral mid and lower lung interstitial pulmonary opacities. Costophrenic angles are not well visualized.  IMPRESSION: Limited evaluation due to body habitus.  Cardiomegaly.  Bilateral mid lower lung interstitial pulmonary opacities may represent edema, atypical infection and/or atelectasis.  The costophrenic angles are not well visualized, small bilateral pleural effusions are not excluded.  Recommend repeat two view radiograph when patient clinically able for further evaluation of the pulmonary parenchyma.   Electronically Signed   By: Annia Belt M.D.   On: 04/14/2014 08:01    Medications:  Prior to Admission:  Prescriptions prior to admission  Medication Sig Dispense Refill Last Dose  . alendronate (FOSAMAX) 70 MG tablet  Take 1 tablet (70 mg total) by mouth once a week. Take with a full glass of water on an empty stomach. 12 tablet 1 Past Week at Unknown time  . aspirin EC 81 MG tablet Take 81 mg by mouth daily.   04/13/2014 at Unknown time  . Blood Glucose Monitoring Suppl (SOLUS V2 BLOOD GLUCOSE SYSTEM) DEVI Use as directed 1 Device 0   . Blood Pressure Monitoring (BLOOD PRESSURE MONITOR AUTOMAT) DEVI Use to check blood pressure twice weekly    401.9 1 Device 0 Taking  . budesonide-formoterol (SYMBICORT) 160-4.5 MCG/ACT inhaler Inhale 2 puffs into the lungs 2 (two) times daily. 1 Inhaler 3 unknown at Unknown time  . buPROPion (WELLBUTRIN SR) 200 MG 12 hr tablet Take 1 tablet (200 mg total) by mouth 2 (two) times daily. 180 tablet 1 04/13/2014 at Unknown time  .  cholecalciferol (VITAMIN D) 1000 UNITS tablet Take 1,000 Units by mouth every morning.     04/13/2014 at Unknown time  . furosemide (LASIX) 20 MG tablet Take 1 tablet (20 mg total) by mouth daily. 30 tablet 3 04/13/2014 at Unknown time  . glipiZIDE (GLUCOTROL) 5 MG tablet Take 0.5 tablets (2.5 mg total) by mouth 2 (two) times daily before a meal. 90 tablet 1 04/13/2014 at Unknown time  . glucose blood (SOLUS V2 TEST) test strip Check sugar daily 100 each 5   . levalbuterol (XOPENEX) 0.31 MG/3ML nebulizer solution Take 1 ampule by nebulization every 4 (four) hours as needed for wheezing.   Past Week at Unknown time  . lisinopril (PRINIVIL,ZESTRIL) 20 MG tablet Take 0.5 tablets (10 mg total) by mouth daily. (Patient taking differently: Take 20 mg by mouth daily. ) 45 tablet 1 04/13/2014 at Unknown time  . lovastatin (MEVACOR) 10 MG tablet TAKE 1 TABLET AT BEDTIME. 90 tablet 1 04/13/2014 at Unknown time  . metFORMIN (GLUCOPHAGE-XR) 500 MG 24 hr tablet Take 2,000 mg by mouth daily with breakfast.   04/13/2014 at Unknown time  . metFORMIN (GLUCOPHAGE-XR) 750 MG 24 hr tablet TAKE (1) TABLET TWICE A DAY. 180 tablet 1 04/13/2014 at Unknown time  . pramipexole (MIRAPEX) 1.5  MG tablet TAKE (1) TABLET TWICE A DAY AS NEEDED. (Patient taking differently: Take 1.5 mg by mouth 2 (two) times daily as needed (restless legs). Take 1 tablet daily at bedtime, then 1 tablet as needed for restless legs.) 60 tablet 5 unknown  . docusate (COLACE) 50 MG/5ML liquid 3 or 4 drops in right ear as needed to soften earwax (Patient not taking: Reported on 04/14/2014) 100 mL 0 Taking  . LORazepam (ATIVAN) 0.5 MG tablet Take 1 tablet (0.5 mg total) by mouth at bedtime. **May take up to three times daily as needed for upset/anxiety** (Patient not taking: Reported on 04/14/2014) 30 tablet 0 Taking  . lovastatin (MEVACOR) 10 MG tablet Take 1 tablet (10 mg total) by mouth at bedtime. (Patient not taking: Reported on 04/14/2014) 90 tablet 1   . meclizine (ANTIVERT) 25 MG tablet Take 1 tablet (25 mg total) by mouth 3 (three) times daily as needed for dizziness. (Patient not taking: Reported on 04/14/2014) 90 tablet 1   . mirtazapine (REMERON) 15 MG tablet Take 1 tablet (15 mg total) by mouth at bedtime. (Patient not taking: Reported on 04/14/2014) 90 tablet 0   . tiotropium (SPIRIVA HANDIHALER) 18 MCG inhalation capsule Place 1 capsule (18 mcg total) into inhaler and inhale daily. (Patient not taking: Reported on 04/14/2014) 90 capsule 1    Scheduled: . antiseptic oral rinse  7 mL Mouth Rinse q12n4p  . aspirin EC  81 mg Oral Daily  . budesonide-formoterol  2 puff Inhalation BID  . chlorhexidine  15 mL Mouth Rinse BID  . enoxaparin (LOVENOX) injection  40 mg Subcutaneous Daily  . furosemide  40 mg Intravenous BID  . guaiFENesin  1,200 mg Oral BID  . insulin aspart  0-15 Units Subcutaneous 6 times per day  . ipratropium  0.5 mg Nebulization Q4H  . levalbuterol  0.63 mg Nebulization Q4H  . levofloxacin (LEVAQUIN) IV  750 mg Intravenous Q24H  . lisinopril  20 mg Oral Daily  . methylPREDNISolone (SOLU-MEDROL) injection  125 mg Intravenous Q6H  . nystatin   Topical BID  . pravastatin  10 mg Oral q1800  .  sodium chloride  3 mL Intravenous Q12H   Continuous:  WUJ:WJXBJY chloride,  acetaminophen, levalbuterol, ondansetron (ZOFRAN) IV, sodium chloride  Assesment: She was admitted with acute on chronic respiratory failure and is still very hypoxic. She has COPD at baseline. She is on appropriate treatment but I have increased her steroids since she's not made much improvement. She looks clinically stable at this time. She also has what appears to be acute congestive heart failure.Potential for pulmonary emboli. Active Problems:   Depression   Essential hypertension, benign   Dyslipidemia   Diabetes mellitus   COPD exacerbation   Acute on chronic respiratory failure   Acute CHF   Acute on chronic respiratory failure with hypercapnia    Plan: Increase steroids. I discussed with her that if she continued to have difficulty and we could not maintain her oxygenation on current treatments that the next step would be intubation and mechanical ventilation. She is not clear if she wants to do that at this time. Her son is in the room with her and I explained that it would be a temporary measure. They want to discuss this and let us know  discussed with Dr. Kerry HoughMemon   LOS: 1 day   Dywane Peruski L 04/15/2014, 8:16 AM

## 2014-04-15 NOTE — Clinical Social Work Note (Signed)
CSW received consult for COPD gold protocol. Pt has not met criteria as this is first admission in 6 months. CSW will sign off, but can be reconsulted if needed.   Benay Pike, [place]

## 2014-04-15 NOTE — Progress Notes (Signed)
Dr Marchelle Gearingamaswamy updated onCO2=46 on pt BMP

## 2014-04-15 NOTE — Progress Notes (Signed)
PT Cancellation Note  Patient Details Name: Molly HeinrichBetty J Mcdonald MRN: [id number] DOB: 1945/08/03   Cancelled Treatment:    Reason Eval/Treat Not Completed: Medical issues which prohibited therapy.  Pt is on Bipap so we will hold orders until breathing is stabilized.   Konrad PentaBrown, Copper Kirtley L 04/15/2014, 2:12 PM

## 2014-04-15 NOTE — Progress Notes (Signed)
OT Cancellation Note  Patient Details Name: Molly Mcdonald MRN: [id number] DOB: 1945-04-22   Cancelled Treatment:    Reason Eval/Treat Not Completed: Medical issues which prohibited therapy. Patient on BiPAP at this time and is not appropriate to be evaluated by therapy. Will evaluate when BiPAP is removed.   Limmie PatriciaLaura Mikeria Valin, OTR/L,CBIS  [phone]  04/15/2014, 8:41 AM

## 2014-04-16 LAB — GLUCOSE, CAPILLARY
GLUCOSE-CAPILLARY: 193 mg/dL — AB (ref 70–99)
GLUCOSE-CAPILLARY: 177 mg/dL — AB (ref 70–99)
GLUCOSE-CAPILLARY: 193 mg/dL — AB (ref 70–99)
Glucose-Capillary: 148 mg/dL — ABNORMAL HIGH (ref 70–99)
Glucose-Capillary: 169 mg/dL — ABNORMAL HIGH (ref 70–99)
Glucose-Capillary: 176 mg/dL — ABNORMAL HIGH (ref 70–99)
Glucose-Capillary: 186 mg/dL — ABNORMAL HIGH (ref 70–99)
Glucose-Capillary: 191 mg/dL — ABNORMAL HIGH (ref 70–99)
Glucose-Capillary: 199 mg/dL — ABNORMAL HIGH (ref 70–99)
Glucose-Capillary: 199 mg/dL — ABNORMAL HIGH (ref 70–99)
Glucose-Capillary: 205 mg/dL — ABNORMAL HIGH (ref 70–99)
Glucose-Capillary: 208 mg/dL — ABNORMAL HIGH (ref 70–99)
Glucose-Capillary: 276 mg/dL — ABNORMAL HIGH (ref 70–99)
Glucose-Capillary: 194 mg/dL — ABNORMAL HIGH (ref 70–99)

## 2014-04-16 LAB — BLOOD GAS, ARTERIAL
Acid-Base Excess: 23.7 mmol/L — ABNORMAL HIGH (ref 0.0–2.0)
Bicarbonate: 49.4 mEq/L — ABNORMAL HIGH (ref 20.0–24.0)
DRAWN BY: 317771
Delivery systems: POSITIVE
EXPIRATORY PAP: 8
FIO2: 0.8 %
INSPIRATORY PAP: 16
O2 Saturation: 93 %
PCO2 ART: 64.6 mmHg — AB (ref 35.0–45.0)
PH ART: 7.496 — AB (ref 7.350–7.450)
PO2 ART: 67.4 mmHg — AB (ref 80.0–100.0)
Patient temperature: 37
TCO2: 43.5 mmol/L (ref 0–100)

## 2014-04-16 LAB — CBC
HCT: 42 % (ref 36.0–46.0)
HEMOGLOBIN: 12.4 g/dL (ref 12.0–15.0)
MCH: 28.4 pg (ref 26.0–34.0)
MCHC: 29.5 g/dL — ABNORMAL LOW (ref 30.0–36.0)
MCV: 96.1 fL (ref 78.0–100.0)
Platelets: 285 10*3/uL (ref 150–400)
RBC: 4.37 MIL/uL (ref 3.87–5.11)
RDW: 17.3 % — ABNORMAL HIGH (ref 11.5–15.5)
WBC: 8.1 10*3/uL (ref 4.0–10.5)

## 2014-04-16 LAB — BASIC METABOLIC PANEL
Anion gap: 11 (ref 5–15)
BUN: 31 mg/dL — AB (ref 6–23)
CALCIUM: 8.8 mg/dL (ref 8.4–10.5)
CO2: 44 mmol/L (ref 19–32)
Chloride: 86 mmol/L — ABNORMAL LOW (ref 96–112)
Creatinine, Ser: 0.98 mg/dL (ref 0.50–1.10)
GFR calc Af Amer: 67 mL/min — ABNORMAL LOW (ref >90)
GFR, EST NON AFRICAN AMERICAN: 58 mL/min — AB (ref >90)
GLUCOSE: 150 mg/dL — AB (ref 70–99)
Potassium: 3.5 mmol/L (ref 3.5–5.1)
Sodium: 141 mmol/L (ref 135–145)

## 2014-04-16 MED ORDER — INSULIN ASPART 100 UNIT/ML ~~LOC~~ SOLN
0.0000 [IU] | Freq: Three times a day (TID) | SUBCUTANEOUS | Status: DC
  Administered 2014-04-16: 3 [IU] via SUBCUTANEOUS
  Administered 2014-04-17: 8 [IU] via SUBCUTANEOUS
  Administered 2014-04-17: 15 [IU] via SUBCUTANEOUS
  Administered 2014-04-18: 5 [IU] via SUBCUTANEOUS
  Administered 2014-04-18: 15 [IU] via SUBCUTANEOUS
  Administered 2014-04-18 – 2014-04-19 (×2): 8 [IU] via SUBCUTANEOUS
  Administered 2014-04-19: 5 [IU] via SUBCUTANEOUS

## 2014-04-16 MED ORDER — INSULIN ASPART 100 UNIT/ML ~~LOC~~ SOLN
0.0000 [IU] | Freq: Every day | SUBCUTANEOUS | Status: DC
  Administered 2014-04-16 – 2014-04-17 (×2): 5 [IU] via SUBCUTANEOUS
  Administered 2014-04-18: 4 [IU] via SUBCUTANEOUS

## 2014-04-16 MED ORDER — PRAMIPEXOLE DIHYDROCHLORIDE 1 MG PO TABS
1.5000 mg | ORAL_TABLET | Freq: Every evening | ORAL | Status: DC | PRN
  Administered 2014-04-17 – 2014-04-18 (×3): 1.5 mg via ORAL
  Filled 2014-04-16 (×3): qty 2

## 2014-04-16 MED ORDER — LEVOFLOXACIN 750 MG PO TABS
750.0000 mg | ORAL_TABLET | Freq: Every day | ORAL | Status: DC
  Administered 2014-04-17 – 2014-04-19 (×3): 750 mg via ORAL
  Filled 2014-04-16 (×3): qty 1

## 2014-04-16 NOTE — Progress Notes (Signed)
PHARMACIST - PHYSICIAN COMMUNICATION DR:   Kerry HoughMemon CONCERNING: Antibiotic IV to Oral Route Change Policy  RECOMMENDATION: This patient is receiving Levaquin by the intravenous route.  Based on criteria approved by the Pharmacy and Therapeutics Committee, the antibiotic(s) is/are being converted to the equivalent oral dose form(s).   DESCRIPTION: These criteria include:  Patient being treated for a respiratory tract infection, urinary tract infection, cellulitis or clostridium difficile associated diarrhea if on metronidazole  The patient is not neutropenic and does not exhibit a GI malabsorption state  The patient is eating (either orally or via tube) and/or has been taking other orally administered medications for a least 24 hours  The patient is improving clinically and has a Tmax < 100.5  If you have questions about this conversion, please contact the Pharmacy Department  [x]   [phone]( [phone] )  [facility] []   [phone]( [phone] )  [facility]  []   [phone]( [phone] )  [facility] []   [phone]( [phone] )  [facility]    Valrie HartScott Rhegan Trunnell, PharmD

## 2014-04-16 NOTE — Progress Notes (Signed)
Subjective: She looks better. She is awake and alert. She is on nasal oxygen and maintaining her oxygen saturation in the mid 80s.  Objective: Vital signs in last 24 hours: Temp:  [97.8 F (36.6 C)-98.7 F (37.1 C)] 98.1 F (36.7 C) (02/16 0800) Pulse Rate:  [84-106] 89 (02/16 0600) Resp:  [15-37] 24 (02/16 0600) BP: (87-130)/(36-80) 101/40 mmHg (02/16 0600) SpO2:  [82 %-96 %] 92 % (02/16 0600) FiO2 (%):  [80 %-100 %] 92 % (02/16 0709) Weight:  [96.5 kg (212 lb 11.9 oz)] 96.5 kg (212 lb 11.9 oz) (02/16 0500) Weight change: -10.2 kg (-22 lb 7.8 oz) Last BM Date: 04/12/14  Intake/Output from previous day: 02/15 0701 - 02/16 0700 In: 390 [P.O.:240; IV Piggyback:150] Out: 3800 [Urine:3800]  PHYSICAL EXAM General appearance: alert, cooperative and mild distress Resp: rhonchi bilaterally Cardio: regular rate and rhythm, S1, S2 normal, no murmur, click, rub or gallop GI: soft, non-tender; bowel sounds normal; no masses,  no organomegaly Extremities: extremities normal, atraumatic, no cyanosis or edema  Lab Results:  Results for orders placed or performed during the hospital encounter of 04/14/14 (from the past 48 hour(s))  MRSA PCR Screening     Status: None   Collection Time: 04/14/14 10:20 AM  Result Value Ref Range   MRSA by PCR NEGATIVE NEGATIVE    Comment:        The GeneXpert MRSA Assay (FDA approved for NASAL specimens only), is one component of a comprehensive MRSA colonization surveillance program. It is not intended to diagnose MRSA infection nor to guide or monitor treatment for MRSA infections.   Troponin I     Status: None   Collection Time: 04/14/14 11:29 AM  Result Value Ref Range   Troponin I <0.03 <0.031 ng/mL    Comment:        NO INDICATION OF MYOCARDIAL INJURY.   Troponin I     Status: None   Collection Time: 04/14/14  4:57 PM  Result Value Ref Range   Troponin I <0.03 <0.031 ng/mL    Comment:        NO INDICATION OF MYOCARDIAL INJURY.    Troponin I     Status: None   Collection Time: 04/14/14 10:23 PM  Result Value Ref Range   Troponin I <0.03 <0.031 ng/mL    Comment:        NO INDICATION OF MYOCARDIAL INJURY.   Basic metabolic panel     Status: Abnormal   Collection Time: 04/15/14  4:41 AM  Result Value Ref Range   Sodium 141 135 - 145 mmol/L   Potassium 4.3 3.5 - 5.1 mmol/L   Chloride 85 (L) 96 - 112 mmol/L    Comment: DELTA CHECK NOTED   CO2 46 (HH) 19 - 32 mmol/L    Comment: CRITICAL RESULT CALLED TO, READ BACK BY AND VERIFIED WITH: LEE,B AT 5:50AM ON 04/15/14 BY FESTERMAN,C    Glucose, Bld 216 (H) 70 - 99 mg/dL   BUN 26 (H) 6 - 23 mg/dL   Creatinine, Ser 0.86 0.50 - 1.10 mg/dL   Calcium 9.0 8.4 - 10.5 mg/dL   GFR calc non Af Amer 67 (L) >90 mL/min   GFR calc Af Amer 78 (L) >90 mL/min    Comment: (NOTE) The eGFR has been calculated using the CKD EPI equation. This calculation has not been validated in all clinical situations. eGFR's persistently <90 mL/min signify possible Chronic Kidney Disease.    Anion gap 10 5 - 15  Blood  gas, arterial     Status: Abnormal   Collection Time: 04/15/14  5:00 AM  Result Value Ref Range   FIO2 0.90 %   Delivery systems BILEVEL POSITIVE AIRWAY PRESSURE    Inspiratory PAP 16    Expiratory PAP 8    pH, Arterial 7.480 (H) 7.350 - 7.450   pCO2 arterial 64.3 (HH) 35.0 - 45.0 mmHg    Comment: CRITICAL RESULT CALLED TO, READ BACK BY AND VERIFIED WITH: BOB LEE,RN BY BFRETWELL RRT,RCP AT 0417 ON 04/15/14    pO2, Arterial 60.4 (L) 80.0 - 100.0 mmHg   Bicarbonate 47.3 (H) 20.0 - 24.0 mEq/L   TCO2 42.2 0 - 100 mmol/L   Acid-Base Excess 21.7 (H) 0.0 - 2.0 mmol/L   O2 Saturation 91.2 %   Patient temperature 37.0    Collection site RIGHT RADIAL    Drawn by [id number]    Sample type ARTERIAL DRAW    Allens test (pass/fail) PASS PASS  Blood gas, arterial     Status: Abnormal   Collection Time: 04/16/14  5:00 AM  Result Value Ref Range   FIO2 0.80 %   Delivery systems BILEVEL  POSITIVE AIRWAY PRESSURE    Inspiratory PAP 16    Expiratory PAP 8    pH, Arterial 7.496 (H) 7.350 - 7.450   pCO2 arterial 64.6 (HH) 35.0 - 45.0 mmHg    Comment: CRITICAL RESULT CALLED TO, READ BACK BY AND VERIFIED WITH: ROBBIE WAGONER,RN BY BFRETWELL RRT,RCP ON 04/16/14 AT 0356    pO2, Arterial 67.4 (L) 80.0 - 100.0 mmHg   Bicarbonate 49.4 (H) 20.0 - 24.0 mEq/L   TCO2 43.5 0 - 100 mmol/L   Acid-Base Excess 23.7 (H) 0.0 - 2.0 mmol/L   O2 Saturation 93.0 %   Patient temperature 37.0    Collection site RIGHT RADIAL    Drawn by [id number]    Sample type ARTERIAL DRAW    Allens test (pass/fail) PASS PASS  Basic metabolic panel     Status: Abnormal   Collection Time: 04/16/14  5:13 AM  Result Value Ref Range   Sodium 141 135 - 145 mmol/L   Potassium 3.5 3.5 - 5.1 mmol/L    Comment: DELTA CHECK NOTED   Chloride 86 (L) 96 - 112 mmol/L   CO2 44 (HH) 19 - 32 mmol/L    Comment: CRITICAL RESULT CALLED TO, READ BACK BY AND VERIFIED WITH: DANIEL,J AT 6:05AM ON 04/16/14 BY FESTERMAN,C    Glucose, Bld 150 (H) 70 - 99 mg/dL   BUN 31 (H) 6 - 23 mg/dL   Creatinine, Ser 0.98 0.50 - 1.10 mg/dL   Calcium 8.8 8.4 - 10.5 mg/dL   GFR calc non Af Amer 58 (L) >90 mL/min   GFR calc Af Amer 67 (L) >90 mL/min    Comment: (NOTE) The eGFR has been calculated using the CKD EPI equation. This calculation has not been validated in all clinical situations. eGFR's persistently <90 mL/min signify possible Chronic Kidney Disease.    Anion gap 11 5 - 15  CBC     Status: Abnormal   Collection Time: 04/16/14  5:13 AM  Result Value Ref Range   WBC 8.1 4.0 - 10.5 K/uL   RBC 4.37 3.87 - 5.11 MIL/uL   Hemoglobin 12.4 12.0 - 15.0 g/dL   HCT 42.0 36.0 - 46.0 %   MCV 96.1 78.0 - 100.0 fL    Comment: DELTA CHECK NOTED RESULT REPEATED AND VERIFIED    MCH 28.4 26.0 -  34.0 pg   MCHC 29.5 (L) 30.0 - 36.0 g/dL   RDW 17.3 (H) 11.5 - 15.5 %   Platelets 285 150 - 400 K/uL    ABGS  Recent Labs  04/16/14 0500  PHART  7.496*  PO2ART 67.4*  TCO2 43.5  HCO3 49.4*   CULTURES Recent Results (from the past 240 hour(s))  MRSA PCR Screening     Status: None   Collection Time: 04/14/14 10:20 AM  Result Value Ref Range Status   MRSA by PCR NEGATIVE NEGATIVE Final    Comment:        The GeneXpert MRSA Assay (FDA approved for NASAL specimens only), is one component of a comprehensive MRSA colonization surveillance program. It is not intended to diagnose MRSA infection nor to guide or monitor treatment for MRSA infections.    Studies/Results: Ct Angio Chest Pe W/cm &/or Wo Cm  04/15/2014   CLINICAL DATA:  Hypoxia.  History of COPD.  EXAM: CT ANGIOGRAPHY CHEST WITH CONTRAST  TECHNIQUE: Multidetector CT imaging of the chest was performed using the standard protocol during bolus administration of intravenous contrast. Multiplanar CT image reconstructions and MIPs were obtained to evaluate the vascular anatomy.  CONTRAST:  100 mL OMNIPAQUE IOHEXOL 350 MG/ML SOLN  COMPARISON:  Plain film of the chest 04/14/2014. PA and lateral chest 03/09/2011.  FINDINGS: No pulmonary embolus is identified. The pulmonary outflow trunk is dilated with normal-appearing right and left main pulmonary arteries suggestive of pulmonary stenosis or possibly pulmonary hypertension. Extensive calcific aortic and coronary atherosclerosis is noted. Bovine type aortic arch is incidentally noted. There is a small right pleural effusion. No left pleural effusion or pericardial effusion. No axillary, hilar or mediastinal lymphadenopathy is identified.  The lungs demonstrate extensive emphysematous change. There is bibasilar airspace disease dependently, much worse on the right. No nodule or mass is identified.  Visualized upper abdomen shows fatty infiltration of the liver. No focal bony abnormality is identified.  Review of the MIP images confirms the above findings.  IMPRESSION: Negative for pulmonary embolus.  Dependent bibasilar airspace disease,  much worse on the right, could be due to atelectasis or pneumonia.  Small right pleural effusion.  Findings compatible with pulmonary stenosis or possibly pulmonary arterial hypertension.  Marked emphysema.  Cardiomegaly.  Extensive calcific aortic and coronary atherosclerosis.  Fatty infiltration of the liver.   Electronically Signed   By: Inge Rise M.D.   On: 04/15/2014 19:09    Medications:  Prior to Admission:  Prescriptions prior to admission  Medication Sig Dispense Refill Last Dose  . alendronate (FOSAMAX) 70 MG tablet Take 1 tablet (70 mg total) by mouth once a week. Take with a full glass of water on an empty stomach. 12 tablet 1 Past Week at Unknown time  . aspirin EC 81 MG tablet Take 81 mg by mouth daily.   04/13/2014 at Unknown time  . Blood Glucose Monitoring Suppl (SOLUS V2 BLOOD GLUCOSE SYSTEM) DEVI Use as directed 1 Device 0   . Blood Pressure Monitoring (BLOOD PRESSURE MONITOR AUTOMAT) DEVI Use to check blood pressure twice weekly    401.9 1 Device 0 Taking  . budesonide-formoterol (SYMBICORT) 160-4.5 MCG/ACT inhaler Inhale 2 puffs into the lungs 2 (two) times daily. 1 Inhaler 3 unknown at Unknown time  . buPROPion (WELLBUTRIN SR) 200 MG 12 hr tablet Take 1 tablet (200 mg total) by mouth 2 (two) times daily. 180 tablet 1 04/13/2014 at Unknown time  . cholecalciferol (VITAMIN D) 1000 UNITS tablet Take  1,000 Units by mouth every morning.     04/13/2014 at Unknown time  . furosemide (LASIX) 20 MG tablet Take 1 tablet (20 mg total) by mouth daily. 30 tablet 3 04/13/2014 at Unknown time  . glipiZIDE (GLUCOTROL) 5 MG tablet Take 0.5 tablets (2.5 mg total) by mouth 2 (two) times daily before a meal. 90 tablet 1 04/13/2014 at Unknown time  . glucose blood (SOLUS V2 TEST) test strip Check sugar daily 100 each 5   . levalbuterol (XOPENEX) 0.31 MG/3ML nebulizer solution Take 1 ampule by nebulization every 4 (four) hours as needed for wheezing.   Past Week at Unknown time  . lisinopril  (PRINIVIL,ZESTRIL) 20 MG tablet Take 0.5 tablets (10 mg total) by mouth daily. (Patient taking differently: Take 20 mg by mouth daily. ) 45 tablet 1 04/13/2014 at Unknown time  . lovastatin (MEVACOR) 10 MG tablet TAKE 1 TABLET AT BEDTIME. 90 tablet 1 04/13/2014 at Unknown time  . metFORMIN (GLUCOPHAGE-XR) 500 MG 24 hr tablet Take 2,000 mg by mouth daily with breakfast.   04/13/2014 at Unknown time  . metFORMIN (GLUCOPHAGE-XR) 750 MG 24 hr tablet TAKE (1) TABLET TWICE A DAY. 180 tablet 1 04/13/2014 at Unknown time  . pramipexole (MIRAPEX) 1.5 MG tablet TAKE (1) TABLET TWICE A DAY AS NEEDED. (Patient taking differently: Take 1.5 mg by mouth 2 (two) times daily as needed (restless legs). Take 1 tablet daily at bedtime, then 1 tablet as needed for restless legs.) 60 tablet 5 unknown  . docusate (COLACE) 50 MG/5ML liquid 3 or 4 drops in right ear as needed to soften earwax (Patient not taking: Reported on 04/14/2014) 100 mL 0 Taking  . LORazepam (ATIVAN) 0.5 MG tablet Take 1 tablet (0.5 mg total) by mouth at bedtime. **May take up to three times daily as needed for upset/anxiety** (Patient not taking: Reported on 04/14/2014) 30 tablet 0 Taking  . lovastatin (MEVACOR) 10 MG tablet Take 1 tablet (10 mg total) by mouth at bedtime. (Patient not taking: Reported on 04/14/2014) 90 tablet 1   . meclizine (ANTIVERT) 25 MG tablet Take 1 tablet (25 mg total) by mouth 3 (three) times daily as needed for dizziness. (Patient not taking: Reported on 04/14/2014) 90 tablet 1   . mirtazapine (REMERON) 15 MG tablet Take 1 tablet (15 mg total) by mouth at bedtime. (Patient not taking: Reported on 04/14/2014) 90 tablet 0   . tiotropium (SPIRIVA HANDIHALER) 18 MCG inhalation capsule Place 1 capsule (18 mcg total) into inhaler and inhale daily. (Patient not taking: Reported on 04/14/2014) 90 capsule 1    Scheduled: . antiseptic oral rinse  7 mL Mouth Rinse q12n4p  . aspirin EC  81 mg Oral Daily  . budesonide-formoterol  2 puff Inhalation  BID  . chlorhexidine  15 mL Mouth Rinse BID  . enoxaparin (LOVENOX) injection  40 mg Subcutaneous Daily  . furosemide  40 mg Intravenous BID  . guaiFENesin  1,200 mg Oral BID  . insulin aspart  0-15 Units Subcutaneous 6 times per day  . ipratropium  0.5 mg Nebulization Q4H  . levalbuterol  0.63 mg Nebulization Q4H  . levofloxacin (LEVAQUIN) IV  750 mg Intravenous Q24H  . methylPREDNISolone (SOLU-MEDROL) injection  125 mg Intravenous Q6H  . nystatin   Topical BID  . pravastatin  10 mg Oral q1800  . sodium chloride  3 mL Intravenous Q12H   Continuous:  QQP:YPPJKD chloride, acetaminophen, levalbuterol, morphine injection, ondansetron (ZOFRAN) IV, sodium chloride  Assesment: She is admitted with  acute on chronic respiratory failure and acute CHF. She is improving. I don't think at this point that she would require intubation and mechanical ventilation but I told the patient her sister and her son that I think they should try to come to some sort of an agreement about whether they would do that if needed. Active Problems:   Depression   Essential hypertension, benign   Dyslipidemia   Diabetes mellitus   COPD exacerbation   Acute on chronic respiratory failure   Acute CHF   Acute on chronic respiratory failure with hypercapnia    Plan: Continue current treatments.    LOS: 2 days   Cipriano Millikan L 04/16/2014, 8:50 AM

## 2014-04-16 NOTE — Progress Notes (Signed)
PT Cancellation Note  Patient Details Name: Molly HeinrichBetty J Mcdonald MRN: [id number] DOB: 1945/03/31   Cancelled Treatment:    Reason Eval/Treat Not Completed: Medical issues which prohibited therapy.  Per RN her respiratory status is not stable although she is off of the Bipap.  We will try again tomorrow and evaluate if possible.   Myrlene BrokerBrown, Keni Wafer L 04/16/2014, 9:21 AM

## 2014-04-16 NOTE — Plan of Care (Signed)
Problem: Food- and Nutrition-Related Knowledge Deficit (NB-1.1) Goal: Nutrition education Formal process to instruct or train a patient/client in a skill or to impart knowledge to help patients/clients voluntarily manage or modify food choices and eating behavior to maintain or improve health. Outcome: Completed/Met Date Met:  04/16/14  Dietetic Intern provided "Carbohydrate Counting for People with Diabetes", " General Healthy Eating Tips" and "1800 Calorie Sample Meal Plan" handout from the Academy of [employer] and [employer]etics. Discussed different food groups and their effects on blood sugar, emphasizing carbohydrate-containing foods as well as healthy options for each food group. Provided list of carbohydrates and recommended serving sizes of common foods.  Provided examples of healthy meal and snack options according to pt reported preferences.  Teach Back Method used.  Expect good compliance.  Pt lives at home, son is primary care taker.  Son and pt educated and son reports they are making a lot of changes at home.    Body mass index is 40.22 kg/(m^2). Pt meets criteria for Obesity Class III based on current BMI.  Current diet order is CHO Mod, patient has been NPO prior to 9:39 am and has not consumed a meal during admission. Labs and medications reviewed. No further nutrition interventions warranted at this time. RD contact information provided. If additional nutrition issues arise, please re-consult RD.  Molly Picker MS Dietetic Intern Pager Number [phone]

## 2014-04-16 NOTE — Plan of Care (Signed)
Problem: Food- and Nutrition-Related Knowledge Deficit (NB-1.1) Goal: Nutrition education Formal process to instruct or train a patient/client in a skill or to impart knowledge to help patients/clients voluntarily manage or modify food choices and eating behavior to maintain or improve health. Outcome: Completed/Met Date Met:  04/16/14  RD provided "Carbohydrate Counting for People with Diabetes", "General Healthy Eating Tips" and "1800-Calorie Sample Menu" handout from the Academy of Nutrition and [employer]etics. Discussed different food groups and their effects on blood sugar, emphasizing carbohydrate-containing foods and healthy options for all food groups. Provided list of carbohydrates and recommended serving sizes of common foods.  Provided examples of meals and snacks based on pt reported preferences.  Teach back method used  Expect good compliance. Pt and son educated.  Son primary care taker, states they are making changes at home.  Body mass index is 40.22 kg/(m^2). Pt meets criteria for Class III Obesity based on current BMI.  Current diet order is CHO mod, patient NPO prior to 9:39 am, has not consumed meals since admission. Labs and medications reviewed. No further nutrition interventions warranted at this time. RD contact information provided. If additional nutrition issues arise, please re-consult RD.  Elmer Picker MS Dietetic Intern Pager Number [phone]

## 2014-04-16 NOTE — Progress Notes (Addendum)
[facility] PROGRESS NOTE  Molly Mcdonald YNW:[id number] DOB: [birth year] DOA: 04/14/2014 PCP: Sherlene Shams, MD  Summary:  This patient was admitted to the hospital with progressive shortness of breath. She is chronically on 5 L for COPD. She was noted to have respiratory acidosis on admission as well as evidence of congestive heart failure. She was started on BiPAP for respiratory support. She was also started on intravenous Lasix and has had excellent urine output. From a cardiac standpoint, she appears to have diastolic heart failure which has significantly improved. Her weight is down approximately 23 pounds since admission. She has severe COPD he is continued on bronchodilators, steroids and antibiotics. Pulmonology has been assisting in her care. She may need to be on nocturnal BiPAP after discharge. She will need a physical therapy evaluation once her clinical condition has further stabilized. She may need placement on discharge.  Assessment/Plan: 1. Acute on chronic respiratory failure. Multifactorial related to COPD as well as CHF. Patient is intermittently on BiPAP. Appreciate pulmonology assistance. CT chest negative for PE. Appreciate pulmonology assistance. Appears to be improving. Continue current treatments. 2. Acute diastolic congestive heart failure. Volume status appears to be improving on clinical exam. If daily weights are accurate, then she is down 23lbs since admission. Continue with intravenous lasix for today. Can likely transition to oral lasix tomorrow. Hold lisinopril since blood pressures are running on the lower side. Echocardiogram has been done with intact ejection fraction. Weights are trending down. 3. COPD exacerbation. Continue bronchodilators, intravenous steroids as well as antibiotics. 4. Diabetes. Sliding scale insulin. 5. Hypertension. Antihypertensives currently on hold due to borderline blood pressures.  6. Dyslipidemia. Continue statin  Code  Status: full code Family Communication: discussed with patient and family at the bedside Disposition Plan: pending hospital course, may need placement   Consultants:  Pulmonology  Procedures:    Antibiotics:  levaquin 2/14>>  HPI/Subjective: Sitting up in bed. Feeling better. Shortness of breath improving.  Objective: Filed Vitals:   04/16/14 0800  BP:   Pulse:   Temp: 98.1 F (36.7 C)  Resp:     Intake/Output Summary (Last 24 hours) at 04/16/14 0929 Last data filed at 04/16/14 0500  Gross per 24 hour  Intake    390 ml  Output   3800 ml  Net  -3410 ml   Filed Weights   04/14/14 1042 04/15/14 0500 04/16/14 0500  Weight: 106.7 kg (235 lb 3.7 oz) 102.6 kg (226 lb 3.1 oz) 96.5 kg (212 lb 11.9 oz)    Exam:   General:  NAD  Cardiovascular: s1, s2, rrr  Respiratory: improved breath sounds bilaterally with crackles at bases  Abdomen: soft, nt, nd, bs+  Musculoskeletal: no LE edema   Data Reviewed: Basic Metabolic Panel:  Recent Labs Lab 04/14/14 0656 04/15/14 0441 04/16/14 0513  NA 140 141 141  K 4.7 4.3 3.5  CL 93* 85* 86*  CO2 43* 46* 44*  GLUCOSE 248* 216* 150*  BUN 26* 26* 31*  CREATININE 0.85 0.86 0.98  CALCIUM 9.0 9.0 8.8   Liver Function Tests: No results for input(s): AST, ALT, ALKPHOS, BILITOT, PROT, ALBUMIN in the last 168 hours. No results for input(s): LIPASE, AMYLASE in the last 168 hours. No results for input(s): AMMONIA in the last 168 hours. CBC:  Recent Labs Lab 04/14/14 0656 04/16/14 0513  WBC 10.7* 8.1  HGB 12.7 12.4  HCT 46.5* 42.0  MCV 103.6* 96.1  PLT 265 285   Cardiac Enzymes:  Recent Labs  Lab 04/14/14 0656 04/14/14 1129 04/14/14 1657 04/14/14 2223  TROPONINI <0.03 <0.03 <0.03 <0.03   BNP (last 3 results)  Recent Labs  04/14/14 0656  BNP 560.0*    ProBNP (last 3 results) No results for input(s): PROBNP in the last 8760 hours.  CBG: No results for input(s): GLUCAP in the last 168  hours.  Recent Results (from the past 240 hour(s))  MRSA PCR Screening     Status: None   Collection Time: 04/14/14 10:20 AM  Result Value Ref Range Status   MRSA by PCR NEGATIVE NEGATIVE Final    Comment:        The GeneXpert MRSA Assay (FDA approved for NASAL specimens only), is one component of a comprehensive MRSA colonization surveillance program. It is not intended to diagnose MRSA infection nor to guide or monitor treatment for MRSA infections.      Studies: Ct Angio Chest Pe W/cm &/or Wo Cm  04/15/2014   CLINICAL DATA:  Hypoxia.  History of COPD.  EXAM: CT ANGIOGRAPHY CHEST WITH CONTRAST  TECHNIQUE: Multidetector CT imaging of the chest was performed using the standard protocol during bolus administration of intravenous contrast. Multiplanar CT image reconstructions and MIPs were obtained to evaluate the vascular anatomy.  CONTRAST:  100 mL OMNIPAQUE IOHEXOL 350 MG/ML SOLN  COMPARISON:  Plain film of the chest 04/14/2014. PA and lateral chest 03/09/2011.  FINDINGS: No pulmonary embolus is identified. The pulmonary outflow trunk is dilated with normal-appearing right and left main pulmonary arteries suggestive of pulmonary stenosis or possibly pulmonary hypertension. Extensive calcific aortic and coronary atherosclerosis is noted. Bovine type aortic arch is incidentally noted. There is a small right pleural effusion. No left pleural effusion or pericardial effusion. No axillary, hilar or mediastinal lymphadenopathy is identified.  The lungs demonstrate extensive emphysematous change. There is bibasilar airspace disease dependently, much worse on the right. No nodule or mass is identified.  Visualized upper abdomen shows fatty infiltration of the liver. No focal bony abnormality is identified.  Review of the MIP images confirms the above findings.  IMPRESSION: Negative for pulmonary embolus.  Dependent bibasilar airspace disease, much worse on the right, could be due to atelectasis or  pneumonia.  Small right pleural effusion.  Findings compatible with pulmonary stenosis or possibly pulmonary arterial hypertension.  Marked emphysema.  Cardiomegaly.  Extensive calcific aortic and coronary atherosclerosis.  Fatty infiltration of the liver.   Electronically Signed   By: Drusilla Kanner M.D.   On: 04/15/2014 19:09    Scheduled Meds: . antiseptic oral rinse  7 mL Mouth Rinse q12n4p  . aspirin EC  81 mg Oral Daily  . budesonide-formoterol  2 puff Inhalation BID  . chlorhexidine  15 mL Mouth Rinse BID  . enoxaparin (LOVENOX) injection  40 mg Subcutaneous Daily  . furosemide  40 mg Intravenous BID  . guaiFENesin  1,200 mg Oral BID  . insulin aspart  0-15 Units Subcutaneous 6 times per day  . ipratropium  0.5 mg Nebulization Q4H  . levalbuterol  0.63 mg Nebulization Q4H  . levofloxacin (LEVAQUIN) IV  750 mg Intravenous Q24H  . methylPREDNISolone (SOLU-MEDROL) injection  125 mg Intravenous Q6H  . nystatin   Topical BID  . pravastatin  10 mg Oral q1800  . sodium chloride  3 mL Intravenous Q12H   Continuous Infusions:   Active Problems:   Depression   Essential hypertension, benign   Dyslipidemia   Diabetes mellitus   COPD exacerbation   Acute on chronic respiratory failure  Acute CHF   Acute on chronic respiratory failure with hypercapnia    Time spent: 25mins    [facility] LLCMEMON,Mylz Yuan  [facility] Pager [phone]. If 7PM-7AM, please contact night-coverage at [email or web address], password [email or web address] 04/16/2014, 9:29 AM  LOS: 2 days

## 2014-04-17 DIAGNOSIS — I5031 Acute diastolic (congestive) heart failure: Principal | ICD-10-CM

## 2014-04-17 DIAGNOSIS — J9621 Acute and chronic respiratory failure with hypoxia: Secondary | ICD-10-CM

## 2014-04-17 LAB — CBC
HEMATOCRIT: 43.8 % (ref 36.0–46.0)
HEMOGLOBIN: 13 g/dL (ref 12.0–15.0)
MCH: 28.1 pg (ref 26.0–34.0)
MCHC: 29.7 g/dL — ABNORMAL LOW (ref 30.0–36.0)
MCV: 94.8 fL (ref 78.0–100.0)
Platelets: 294 10*3/uL (ref 150–400)
RBC: 4.62 MIL/uL (ref 3.87–5.11)
RDW: 17.4 % — ABNORMAL HIGH (ref 11.5–15.5)
WBC: 6.5 10*3/uL (ref 4.0–10.5)

## 2014-04-17 LAB — BASIC METABOLIC PANEL
Anion gap: 12 (ref 5–15)
BUN: 41 mg/dL — AB (ref 6–23)
CALCIUM: 8.4 mg/dL (ref 8.4–10.5)
CHLORIDE: 84 mmol/L — AB (ref 96–112)
CO2: 44 mmol/L — AB (ref 19–32)
Creatinine, Ser: 1.11 mg/dL — ABNORMAL HIGH (ref 0.50–1.10)
GFR calc Af Amer: 57 mL/min — ABNORMAL LOW (ref >90)
GFR calc non Af Amer: 49 mL/min — ABNORMAL LOW (ref >90)
Glucose, Bld: 284 mg/dL — ABNORMAL HIGH (ref 70–99)
Potassium: 2.6 mmol/L — CL (ref 3.5–5.1)
Sodium: 140 mmol/L (ref 135–145)

## 2014-04-17 MED ORDER — POTASSIUM CHLORIDE CRYS ER 20 MEQ PO TBCR
60.0000 meq | EXTENDED_RELEASE_TABLET | Freq: Once | ORAL | Status: AC
  Administered 2014-04-17: 60 meq via ORAL
  Filled 2014-04-17: qty 3

## 2014-04-17 MED ORDER — METHYLPREDNISOLONE SODIUM SUCC 125 MG IJ SOLR
80.0000 mg | Freq: Four times a day (QID) | INTRAMUSCULAR | Status: DC
  Administered 2014-04-17 (×3): 80 mg via INTRAVENOUS
  Filled 2014-04-17 (×3): qty 2

## 2014-04-17 MED ORDER — ALUM & MAG HYDROXIDE-SIMETH 200-200-20 MG/5ML PO SUSP
15.0000 mL | Freq: Four times a day (QID) | ORAL | Status: DC | PRN
  Administered 2014-04-17 – 2014-04-18 (×2): 15 mL via ORAL
  Filled 2014-04-17 (×2): qty 30

## 2014-04-17 MED ORDER — ALPRAZOLAM 0.5 MG PO TABS
1.0000 mg | ORAL_TABLET | Freq: Once | ORAL | Status: AC
Start: 2014-04-17 — End: 2014-04-17
  Administered 2014-04-17: 1 mg via ORAL
  Filled 2014-04-17: qty 2

## 2014-04-17 NOTE — Progress Notes (Signed)
eLink Physician-Brief Progress Note Patient Name: Molly HeinrichBetty J Everding DOB: Jul 02, 1945 MRN: [id number]   Date of Service  04/17/2014  HPI/Events of Note    eICU Interventions  Hypokalemia, repleted      Intervention Category Intermediate Interventions: Electrolyte abnormality - evaluation and management  MCQUAID, DOUGLAS 04/17/2014, 6:39 AM

## 2014-04-17 NOTE — Progress Notes (Signed)
OT Cancellation Note  Patient Details Name: Molly Mcdonald MRN: [id number] DOB: February 13, 1946   Cancelled Treatment:    Reason Eval/Treat Not Completed: Medical issues which prohibited therapy. Pt is on BiPAP again this AM and is not appropriate for OT evaluation at this time. Will eval patient when medically stable.  Limmie PatriciaLaura Tyler Robidoux, OTR/L,CBIS  [phone]  04/17/2014, 10:02 AM

## 2014-04-17 NOTE — Progress Notes (Signed)
eLink Physician-Brief Progress Note Patient Name: Molly HeinrichBetty J Mcdonald DOB: 09-23-1945 MRN: [id number]   Date of Service  04/17/2014  HPI/Events of Note  Pt requested med for anxiety before going on BiPAP  eICU Interventions  Xanax ordered x1     Intervention Category Minor Interventions: Agitation / anxiety - evaluation and management  Ples Trudel S. 04/17/2014, 9:45 PM

## 2014-04-17 NOTE — Progress Notes (Signed)
Subjective: She says she feels better. She has no new complaints except she has diffuse aching. Her potassium was low and is being replaced. She is on BiPAP at this time.  Objective: Vital signs in last 24 hours: Temp:  [97.6 F (36.4 C)-98.4 F (36.9 C)] 98 F (36.7 C) (02/17 0739) Pulse Rate:  [78-110] 102 (02/17 0717) Resp:  [16-34] 21 (02/17 0717) BP: (113-155)/(49-115) 127/68 mmHg (02/17 0500) SpO2:  [78 %-100 %] 93 % (02/17 0717) FiO2 (%):  [50 %-100 %] 50 % (02/17 0714) Weight:  [96.4 kg (212 lb 8.4 oz)] 96.4 kg (212 lb 8.4 oz) (02/17 0500) Weight change: -0.1 kg (-3.5 oz) Last BM Date: 04/12/14  Intake/Output from previous day: 02/16 0701 - 02/17 0700 In: 150 [IV Piggyback:150] Out: 2300 [Urine:2300]  PHYSICAL EXAM General appearance: alert, cooperative and mild distress Resp: rhonchi bilaterally Cardio: regular rate and rhythm, S1, S2 normal, no murmur, click, rub or gallop GI: soft, non-tender; bowel sounds normal; no masses,  no organomegaly Extremities: extremities normal, atraumatic, no cyanosis or edema  Lab Results:  Results for orders placed or performed during the hospital encounter of 04/14/14 (from the past 48 hour(s))  Glucose, capillary     Status: Abnormal   Collection Time: 04/15/14 11:26 AM  Result Value Ref Range   Glucose-Capillary 199 (H) 70 - 99 mg/dL   Comment 1 Notify RN   Glucose, capillary     Status: Abnormal   Collection Time: 04/15/14  4:16 PM  Result Value Ref Range   Glucose-Capillary 177 (H) 70 - 99 mg/dL   Comment 1 Notify RN   Glucose, capillary     Status: Abnormal   Collection Time: 04/15/14  8:01 PM  Result Value Ref Range   Glucose-Capillary 199 (H) 70 - 99 mg/dL   Comment 1 Notify RN   Glucose, capillary     Status: Abnormal   Collection Time: 04/16/14 12:01 AM  Result Value Ref Range   Glucose-Capillary 176 (H) 70 - 99 mg/dL   Comment 1 Notify RN   Glucose, capillary     Status: Abnormal   Collection Time: 04/16/14   4:18 AM  Result Value Ref Range   Glucose-Capillary 148 (H) 70 - 99 mg/dL   Comment 1 Notify RN   Blood gas, arterial     Status: Abnormal   Collection Time: 04/16/14  5:00 AM  Result Value Ref Range   FIO2 0.80 %   Delivery systems BILEVEL POSITIVE AIRWAY PRESSURE    Inspiratory PAP 16    Expiratory PAP 8    pH, Arterial 7.496 (H) 7.350 - 7.450   pCO2 arterial 64.6 (HH) 35.0 - 45.0 mmHg    Comment: CRITICAL RESULT CALLED TO, READ BACK BY AND VERIFIED WITH: ROBBIE WAGONER,RN BY BFRETWELL RRT,RCP ON 04/16/14 AT 0356    pO2, Arterial 67.4 (L) 80.0 - 100.0 mmHg   Bicarbonate 49.4 (H) 20.0 - 24.0 mEq/L   TCO2 43.5 0 - 100 mmol/L   Acid-Base Excess 23.7 (H) 0.0 - 2.0 mmol/L   O2 Saturation 93.0 %   Patient temperature 37.0    Collection site RIGHT RADIAL    Drawn by [id number]    Sample type ARTERIAL DRAW    Allens test (pass/fail) PASS PASS  Basic metabolic panel     Status: Abnormal   Collection Time: 04/16/14  5:13 AM  Result Value Ref Range   Sodium 141 135 - 145 mmol/L   Potassium 3.5 3.5 - 5.1 mmol/L  Comment: DELTA CHECK NOTED   Chloride 86 (L) 96 - 112 mmol/L   CO2 44 (HH) 19 - 32 mmol/L    Comment: CRITICAL RESULT CALLED TO, READ BACK BY AND VERIFIED WITH: DANIEL,J AT 6:05AM ON 04/16/14 BY FESTERMAN,C    Glucose, Bld 150 (H) 70 - 99 mg/dL   BUN 31 (H) 6 - 23 mg/dL   Creatinine, Ser 0.98 0.50 - 1.10 mg/dL   Calcium 8.8 8.4 - 10.5 mg/dL   GFR calc non Af Amer 58 (L) >90 mL/min   GFR calc Af Amer 67 (L) >90 mL/min    Comment: (NOTE) The eGFR has been calculated using the CKD EPI equation. This calculation has not been validated in all clinical situations. eGFR's persistently <90 mL/min signify possible Chronic Kidney Disease.    Anion gap 11 5 - 15  CBC     Status: Abnormal   Collection Time: 04/16/14  5:13 AM  Result Value Ref Range   WBC 8.1 4.0 - 10.5 K/uL   RBC 4.37 3.87 - 5.11 MIL/uL   Hemoglobin 12.4 12.0 - 15.0 g/dL   HCT 42.0 36.0 - 46.0 %   MCV 96.1  78.0 - 100.0 fL    Comment: DELTA CHECK NOTED RESULT REPEATED AND VERIFIED    MCH 28.4 26.0 - 34.0 pg   MCHC 29.5 (L) 30.0 - 36.0 g/dL   RDW 17.3 (H) 11.5 - 15.5 %   Platelets 285 150 - 400 K/uL  Glucose, capillary     Status: Abnormal   Collection Time: 04/16/14  7:42 AM  Result Value Ref Range   Glucose-Capillary 193 (H) 70 - 99 mg/dL   Comment 1 Notify RN   Glucose, capillary     Status: Abnormal   Collection Time: 04/16/14 11:04 AM  Result Value Ref Range   Glucose-Capillary 276 (H) 70 - 99 mg/dL   Comment 1 Notify RN   Glucose, capillary     Status: Abnormal   Collection Time: 04/16/14  4:43 PM  Result Value Ref Range   Glucose-Capillary 194 (H) 70 - 99 mg/dL   Comment 1 Notify RN   Basic metabolic panel     Status: Abnormal   Collection Time: 04/17/14  4:21 AM  Result Value Ref Range   Sodium 140 135 - 145 mmol/L   Potassium 2.6 (LL) 3.5 - 5.1 mmol/L    Comment: CRITICAL RESULT CALLED TO, READ BACK BY AND VERIFIED WITH: MARTIN,S AT 6:25AM ON 04/17/14 BY FESTERMAN,C    Chloride 84 (L) 96 - 112 mmol/L   CO2 44 (HH) 19 - 32 mmol/L    Comment: CRITICAL RESULT CALLED TO, READ BACK BY AND VERIFIED WITH: MARTIN,S AT 6:25AM ON 04/17/14 BY FESTERMAN,C    Glucose, Bld 284 (H) 70 - 99 mg/dL   BUN 41 (H) 6 - 23 mg/dL   Creatinine, Ser 1.11 (H) 0.50 - 1.10 mg/dL   Calcium 8.4 8.4 - 10.5 mg/dL   GFR calc non Af Amer 49 (L) >90 mL/min   GFR calc Af Amer 57 (L) >90 mL/min    Comment: (NOTE) The eGFR has been calculated using the CKD EPI equation. This calculation has not been validated in all clinical situations. eGFR's persistently <90 mL/min signify possible Chronic Kidney Disease.    Anion gap 12 5 - 15  CBC     Status: Abnormal   Collection Time: 04/17/14  4:21 AM  Result Value Ref Range   WBC 6.5 4.0 - 10.5 K/uL   RBC  4.62 3.87 - 5.11 MIL/uL   Hemoglobin 13.0 12.0 - 15.0 g/dL   HCT 43.8 36.0 - 46.0 %   MCV 94.8 78.0 - 100.0 fL   MCH 28.1 26.0 - 34.0 pg   MCHC 29.7  (L) 30.0 - 36.0 g/dL   RDW 17.4 (H) 11.5 - 15.5 %   Platelets 294 150 - 400 K/uL    ABGS  Recent Labs  04/16/14 0500  PHART 7.496*  PO2ART 67.4*  TCO2 43.5  HCO3 49.4*   CULTURES Recent Results (from the past 240 hour(s))  MRSA PCR Screening     Status: None   Collection Time: 04/14/14 10:20 AM  Result Value Ref Range Status   MRSA by PCR NEGATIVE NEGATIVE Final    Comment:        The GeneXpert MRSA Assay (FDA approved for NASAL specimens only), is one component of a comprehensive MRSA colonization surveillance program. It is not intended to diagnose MRSA infection nor to guide or monitor treatment for MRSA infections.    Studies/Results: Ct Angio Chest Pe W/cm &/or Wo Cm  04/15/2014   CLINICAL DATA:  Hypoxia.  History of COPD.  EXAM: CT ANGIOGRAPHY CHEST WITH CONTRAST  TECHNIQUE: Multidetector CT imaging of the chest was performed using the standard protocol during bolus administration of intravenous contrast. Multiplanar CT image reconstructions and MIPs were obtained to evaluate the vascular anatomy.  CONTRAST:  100 mL OMNIPAQUE IOHEXOL 350 MG/ML SOLN  COMPARISON:  Plain film of the chest 04/14/2014. PA and lateral chest 03/09/2011.  FINDINGS: No pulmonary embolus is identified. The pulmonary outflow trunk is dilated with normal-appearing right and left main pulmonary arteries suggestive of pulmonary stenosis or possibly pulmonary hypertension. Extensive calcific aortic and coronary atherosclerosis is noted. Bovine type aortic arch is incidentally noted. There is a small right pleural effusion. No left pleural effusion or pericardial effusion. No axillary, hilar or mediastinal lymphadenopathy is identified.  The lungs demonstrate extensive emphysematous change. There is bibasilar airspace disease dependently, much worse on the right. No nodule or mass is identified.  Visualized upper abdomen shows fatty infiltration of the liver. No focal bony abnormality is identified.  Review  of the MIP images confirms the above findings.  IMPRESSION: Negative for pulmonary embolus.  Dependent bibasilar airspace disease, much worse on the right, could be due to atelectasis or pneumonia.  Small right pleural effusion.  Findings compatible with pulmonary stenosis or possibly pulmonary arterial hypertension.  Marked emphysema.  Cardiomegaly.  Extensive calcific aortic and coronary atherosclerosis.  Fatty infiltration of the liver.   Electronically Signed   By: Inge Rise M.D.   On: 04/15/2014 19:09    Medications:  Prior to Admission:  Prescriptions prior to admission  Medication Sig Dispense Refill Last Dose  . alendronate (FOSAMAX) 70 MG tablet Take 1 tablet (70 mg total) by mouth once a week. Take with a full glass of water on an empty stomach. 12 tablet 1 Past Week at Unknown time  . aspirin EC 81 MG tablet Take 81 mg by mouth daily.   04/13/2014 at Unknown time  . Blood Glucose Monitoring Suppl (SOLUS V2 BLOOD GLUCOSE SYSTEM) DEVI Use as directed 1 Device 0   . Blood Pressure Monitoring (BLOOD PRESSURE MONITOR AUTOMAT) DEVI Use to check blood pressure twice weekly    401.9 1 Device 0 Taking  . budesonide-formoterol (SYMBICORT) 160-4.5 MCG/ACT inhaler Inhale 2 puffs into the lungs 2 (two) times daily. 1 Inhaler 3 unknown at Unknown time  . buPROPion (  WELLBUTRIN SR) 200 MG 12 hr tablet Take 1 tablet (200 mg total) by mouth 2 (two) times daily. 180 tablet 1 04/13/2014 at Unknown time  . cholecalciferol (VITAMIN D) 1000 UNITS tablet Take 1,000 Units by mouth every morning.     04/13/2014 at Unknown time  . furosemide (LASIX) 20 MG tablet Take 1 tablet (20 mg total) by mouth daily. 30 tablet 3 04/13/2014 at Unknown time  . glipiZIDE (GLUCOTROL) 5 MG tablet Take 0.5 tablets (2.5 mg total) by mouth 2 (two) times daily before a meal. 90 tablet 1 04/13/2014 at Unknown time  . glucose blood (SOLUS V2 TEST) test strip Check sugar daily 100 each 5   . levalbuterol (XOPENEX) 0.31 MG/3ML  nebulizer solution Take 1 ampule by nebulization every 4 (four) hours as needed for wheezing.   Past Week at Unknown time  . lisinopril (PRINIVIL,ZESTRIL) 20 MG tablet Take 0.5 tablets (10 mg total) by mouth daily. (Patient taking differently: Take 20 mg by mouth daily. ) 45 tablet 1 04/13/2014 at Unknown time  . lovastatin (MEVACOR) 10 MG tablet TAKE 1 TABLET AT BEDTIME. 90 tablet 1 04/13/2014 at Unknown time  . metFORMIN (GLUCOPHAGE-XR) 500 MG 24 hr tablet Take 2,000 mg by mouth daily with breakfast.   04/13/2014 at Unknown time  . metFORMIN (GLUCOPHAGE-XR) 750 MG 24 hr tablet TAKE (1) TABLET TWICE A DAY. 180 tablet 1 04/13/2014 at Unknown time  . pramipexole (MIRAPEX) 1.5 MG tablet TAKE (1) TABLET TWICE A DAY AS NEEDED. (Patient taking differently: Take 1.5 mg by mouth 2 (two) times daily as needed (restless legs). Take 1 tablet daily at bedtime, then 1 tablet as needed for restless legs.) 60 tablet 5 unknown  . docusate (COLACE) 50 MG/5ML liquid 3 or 4 drops in right ear as needed to soften earwax (Patient not taking: Reported on 04/14/2014) 100 mL 0 Taking  . LORazepam (ATIVAN) 0.5 MG tablet Take 1 tablet (0.5 mg total) by mouth at bedtime. **May take up to three times daily as needed for upset/anxiety** (Patient not taking: Reported on 04/14/2014) 30 tablet 0 Taking  . lovastatin (MEVACOR) 10 MG tablet Take 1 tablet (10 mg total) by mouth at bedtime. (Patient not taking: Reported on 04/14/2014) 90 tablet 1   . meclizine (ANTIVERT) 25 MG tablet Take 1 tablet (25 mg total) by mouth 3 (three) times daily as needed for dizziness. (Patient not taking: Reported on 04/14/2014) 90 tablet 1   . mirtazapine (REMERON) 15 MG tablet Take 1 tablet (15 mg total) by mouth at bedtime. (Patient not taking: Reported on 04/14/2014) 90 tablet 0   . tiotropium (SPIRIVA HANDIHALER) 18 MCG inhalation capsule Place 1 capsule (18 mcg total) into inhaler and inhale daily. (Patient not taking: Reported on 04/14/2014) 90 capsule 1     Scheduled: . antiseptic oral rinse  7 mL Mouth Rinse q12n4p  . aspirin EC  81 mg Oral Daily  . budesonide-formoterol  2 puff Inhalation BID  . chlorhexidine  15 mL Mouth Rinse BID  . enoxaparin (LOVENOX) injection  40 mg Subcutaneous Daily  . furosemide  40 mg Intravenous BID  . guaiFENesin  1,200 mg Oral BID  . insulin aspart  0-15 Units Subcutaneous TID WC  . insulin aspart  0-5 Units Subcutaneous QHS  . ipratropium  0.5 mg Nebulization Q4H  . levalbuterol  0.63 mg Nebulization Q4H  . levofloxacin  750 mg Oral Daily  . methylPREDNISolone (SOLU-MEDROL) injection  125 mg Intravenous Q6H  . nystatin  Topical BID  . pravastatin  10 mg Oral q1800  . sodium chloride  3 mL Intravenous Q12H   Continuous:  LLV:DIXVEZ chloride, acetaminophen, levalbuterol, morphine injection, ondansetron (ZOFRAN) IV, pramipexole, sodium chloride  Assesment: She was admitted with acute on chronic respiratory failure with COPD exacerbation. She is also felt to have acute diastolic heart failure. She is gradually improving. Active Problems:   Depression   Essential hypertension, benign   Dyslipidemia   Diabetes mellitus   COPD exacerbation   Acute on chronic respiratory failure   Acute diastolic CHF (congestive heart failure)   Acute on chronic respiratory failure with hypercapnia    Plan: Continue current treatments.   LOS: 3 days   Ayaan Ringle L 04/17/2014, 7:50 AM

## 2014-04-17 NOTE — Progress Notes (Signed)
PROGRESS NOTE  ROCHEL PRIVETT ZOX:[id number] DOB: 1945/03/04 DOA: 04/14/2014 PCP: Sherlene Shams, MD  Summary: 69 year old woman with advanced COPD, oxygen dependent on 5 L, presented to the emergency department after falling out of bed. Noted to be short of breath and confused. Admitted for acute hypercapnic respiratory failure with respiratory acidosis, COPD exacerbation, acute CHF type unknown. Placed on BiPAP, antibiotics and Lasix with excellent diuresis.  Assessment/Plan: 1. Acute hypercapnic respiratory failure, acute respiratory acidosis; acute on chronic hypoxic respiratory failure. Likely multifactorial: COPD, CHF, obesity hypoventilation, consider obstructive sleep apnea. Acute components appear resolved. 2. COPD exacerbation, oxygen dependent COPD. Appears to be slowly improving on current therapy. 3. Chronic hypoxic respiratory failure on 5 L nasal cannula at home 4. Acute encephalopathy. Resolved. Secondary to hypercapnic respiratory failure/acute. 5. Acute diastolic congestive heart failure. Improving. Appears to be euvolemic at this time. Creatinine trending upwards. 6. Diabetes mellitus. Stable. 7. Obesity unspecified  8. Hypokalemia.   Continue antibiotics, steroids, bronchodilators, BiPAP at night, oxygen supplementation. Pulmonology recommendations appreciated. Plan to decrease steroids.  Hold diuretics today, start oral diuretics tomorrow. Check basic metabolic panel.  Replace potassium.  Code Status: full code DVT prophylaxis: Lovenox Family Communication: Discussed with son at bedside Disposition Plan: SNF  Brendia Sacks, MD  [facility]  Pager [phone] If 7PM-7AM, please contact night-coverage at [email or web address], password [facility] 04/17/2014, 10:31 AM  LOS: 3 days   Consultants:  Pulmonology  Physical therapy: Skilled nursing facility  Procedures:  BiPAP  2-D echocardiogram Study Conclusions  - Left ventricle: E/E&quot; over 45 suggesting  very high EDP but 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 The cavity size was normal. Wall thickness was normal. Systolic function was normal. The estimated ejection fraction was in the range of 55% to 60%. - Mitral valve: Severe MAC leaflet motion not well seen but doppler shows no MS. Valve area by continuity equation (using LVOT flow): 1.02 cm^2. - Left atrium: The atrium was severely dilated. - Right ventricle: The cavity size was moderately dilated. - Right atrium: The atrium was moderately dilated. - Pulmonary arteries: PA peak pressure: 52 mm Hg (S).  Antibiotics:  Ceftriaxone 2/14  Levaquin 2/14 >>  HPI/Subjective: Physical therapy has recommended skilled nursing facility.  Feels good. Breathing much better. Significant decrease in bilateral lower extremity edema. No complaints.  Objective: Filed Vitals:   04/17/14 0739 04/17/14 0800 04/17/14 0900 04/17/14 1000  BP:  141/77 123/59 107/54  Pulse:  108 113 108  Temp: 98 F (36.7 C)     TempSrc: Axillary     Resp:  Height:      Weight:      SpO2:  97% 97% 96%    Intake/Output Summary (Last 24 hours) at 04/17/14 1031 Last data filed at 04/17/14 0900  Gross per 24 hour  Intake    120 ml  Output   2300 ml  Net  -2180 ml     Filed Weights   04/15/14 0500 04/16/14 0500 04/17/14 0500  Weight: 102.6 kg (226 lb 3.1 oz) 96.5 kg (212 lb 11.9 oz) 96.4 kg (212 lb 8.4 oz)    Exam:     Afebrile, tachycardic, normotensive General:  Appears calm and comfortable, sitting in chair ENT: grossly normal hearing Cardiovascular: RRR, no m/r/g. No LE edema. Telemetry: ST, no arrhythmias  Respiratory: CTA bilaterally, no w/r/r. Fair air movement. Speaks in full sentences. Mild increased respiratory effort. Musculoskeletal: grossly normal tone BUE/BLE Psychiatric: grossly normal mood and affect,  speech fluent and appropriate Neurologic: grossly non-focal.  Data Reviewed:  Urine output 2300  -9 L since  admission  Capillary blood sugar stable  Potassium 2.6, BUN climbing, 41; creatinine somewhat elevated 1.11  CBC unremarkable  Pertinent data:  Labs   Imaging   CT chest: No evidence of PE. Atelectasis versus pneumonia. Small right pleural effusion. Marked emphysema. Other    Pending data:    Scheduled Meds: . antiseptic oral rinse  7 mL Mouth Rinse q12n4p  . aspirin EC  81 mg Oral Daily  . budesonide-formoterol  2 puff Inhalation BID  . chlorhexidine  15 mL Mouth Rinse BID  . enoxaparin (LOVENOX) injection  40 mg Subcutaneous Daily  . furosemide  40 mg Intravenous BID  . guaiFENesin  1,200 mg Oral BID  . insulin aspart  0-15 Units Subcutaneous TID WC  . insulin aspart  0-5 Units Subcutaneous QHS  . ipratropium  0.5 mg Nebulization Q4H  . levalbuterol  0.63 mg Nebulization Q4H  . levofloxacin  750 mg Oral Daily  . methylPREDNISolone (SOLU-MEDROL) injection  125 mg Intravenous Q6H  . nystatin   Topical BID  . pravastatin  10 mg Oral q1800  . sodium chloride  3 mL Intravenous Q12H   Continuous Infusions:   Active Problems:   Depression   Essential hypertension, benign   Dyslipidemia   Diabetes mellitus   COPD exacerbation   Acute on chronic respiratory failure   Acute diastolic CHF (congestive heart failure)   Acute on chronic respiratory failure with hypercapnia   Time spent 20 minutes

## 2014-04-17 NOTE — Clinical Social Work Psychosocial (Signed)
Clinical Social Work Department BRIEF PSYCHOSOCIAL ASSESSMENT 04/17/2014  Patient:  Molly Mcdonald, Molly Mcdonald     Account Number:  [id number]     Admit date:  04/14/2014  Clinical Social Worker:  Legrand Como  Date/Time:  04/17/2014 01:39 PM  Referred by:  CSW  Date Referred:  04/17/2014 Referred for  SNF Placement   Other Referral:   Interview type:  Patient Other interview type:   Patient  Son, Molly Mcdonald  Sister, Molly Mcdonald    PSYCHOSOCIAL DATA Living Status:  FRIEND(S) Admitted from facility:   Level of care:   Primary support name:  Molly Mcdonald Primary support relationship to patient:  CHILD, ADULT Degree of support available:   Family is supportive    CURRENT CONCERNS Current Concerns  Post-Acute Placement   Other Concerns:    SOCIAL WORK ASSESSMENT / PLAN CSW met with patient, patient's son, Molly Mcdonald and patient's sister, Molly Mcdonald.  Patient was alert and oriented.  Patient indicated that she was aware of the SNF recommendation and that she was willing to go.  Patient indicated that she resides in her home and that friends of the family were living with her and were supposed to be providing care for her and paying some rent.  She indicated that she has not been receiving proper care from the friends. Molly Mcdonald indicated that he was prepared to tell the family friends that his mother's needs were greater than there were willing to provide and that they would need to seek alternative housing.  He indicated that he was going to move in with patient.  Patient's sister indicated that she was very upset by the condition that patient was in and that her bathrobe had feces all over it where no one was caring for her.  Patient became tearful and indicated that she was concerned about the child of her caregivers.  Patient's son reassured patient and indicated that he would allow them time to find alternative housing. Patient indicated that she ambulates with  a walker and that she needs assistance with bathing and toileting.  She indicated that she is not capable of wiping herself after a bowel movement. CSW provided family with SNF list. Patient also reported that she was legally blind. Patient gave CSW permission to send clinicals to [facility], [place], [place], [facility], [facility] and [facility].  CSW obtained PASRR number.  CSW faxed clinicals to above mentioned facilities.   Assessment/plan status:  Information/Referral to [employer] Other assessment/ plan:   Information/referral to community resources:    PATIENT'S/FAMILY'S RESPONSE TO PLAN OF CARE: Patient is comfortable and agreeable with going to SNF.    Molly Mcdonald, [place]

## 2014-04-17 NOTE — Progress Notes (Signed)
Inpatient Diabetes Program Recommendations 04/17/14  AACE/ADA: New Consensus Statement on Inpatient Glycemic Control (2013)  Target Ranges:  Prepandial:   less than 140 mg/dL      Peak postprandial:   less than 180 mg/dL (1-2 hours)      Critically ill patients:  140 - 180 mg/dL   Reason for Visit: CBGs: 284 (this am), 356 (per nursing) Called nurse because only one CBG was in chart.  Nurse reported that the lunch time CBG was not taken and therefore no correction insulin was given.  Pt on steroids. Will follow.  Diabetes history: Type 2 Outpatient Diabetes medications:Glucotrol Current orders for Inpatient glycemic control: Moderate Correction scale tidac and hs  Mellissa KohutSherrie Areta Terwilliger RD, CDE. M.Ed. Pager [phone] Inpatient Diabetes Coordinator

## 2014-04-17 NOTE — Evaluation (Signed)
Physical Therapy Evaluation Patient Details Name: Molly HeinrichBetty J Stick MRN: [id number] DOB: 28-Mar-1945 Today's Date: 04/17/2014   History of Present Illness  Molly HeinrichBetty J Mich is a 69 y.o. female with advanced COPD, chronically on 5 L of oxygen. Patient was brought to the emergency room today after she fell out of her bed this morning, hitting her nose and causing epistaxis. Her family noted to be significantly short of breath and they called 911. They report that she has been increasingly short of breath. The last several days as well as having progressively worsening confusion. She has a chronic cough which appears to be unchanged. Did not report any fevers. No vomiting or diarrhea. No chest pain. They've also noticed that her lower extremity edema has began to worsen.  Clinical Impression  Pt has decreased activity tolerance.  She will need and agrees to SNF placement.  Exercise and ambulation completed with 02 at 20.    Follow Up Recommendations SNF    Equipment Recommendations  n/A   Recommendations for Other Services OT consult     Precautions / Restrictions Precautions Precautions: Fall;Other (comment) Precaution Comments: O2 stat Restrictions Weight Bearing Restrictions: No      Mobility  Bed Mobility   Bed Mobility: Rolling Rolling: Modified independent (Device/Increase time)   Supine to sit: Min assist        Transfers Overall transfer level: Needs assistance   Transfers: Sit to/from Stand;Stand Pivot Transfers Sit to Stand: Min assist Stand pivot transfers: Min guard          Ambulation/Gait Ambulation/Gait assistance: Supervision Ambulation Distance (Feet): 6 Feet Assistive device: Rolling walker (2 wheeled)     Gait velocity interpretation: Below normal speed for age/gender               Pertinent Vitals/Pain Pain Assessment:  (no pain just aching from fall, and from IV )    Home Living Family/patient expects to be discharged to:: Private  residence Living Arrangements: Non-relatives/Friends   Type of Home: House Home Access: Stairs to enter Entrance Stairs-Rails: None (states that there is a rope that she holds onto) Entergy CorporationEntrance Stairs-Number of Steps: 3- small step  Home Layout: One level Home Equipment: Walker - 2 wheels;Cane - single point;Bedside commode;Shower seat;Wheelchair - manual Additional Comments: PT son states that pt has been declining states that she can not walk from her bed to the shower any longer.      Prior Function Level of Independence: Independent with assistive device(s)                  Extremity/Trunk Assessment               Lower Extremity Assessment: Generalized weakness         Communication   Communication: No difficulties  Cognition Arousal/Alertness: Awake/alert   Overall Cognitive Status: Within Functional Limits for tasks assessed                         Exercises General Exercises - Lower Extremity Ankle Circles/Pumps: Both;10 reps Quad Sets: Both;10 reps Gluteal Sets: Both;10 reps Heel Slides: Both;10 reps Hip ABduction/ADduction: Both;10 reps;Supine Straight Leg Raises: Both;10 reps      Assessment/Plan    PT Assessment Patient needs continued PT services  PT Diagnosis Difficulty walking;Generalized weakness   PT Problem List Decreased strength;Decreased activity tolerance;Obesity  PT Treatment Interventions Therapeutic activities;Therapeutic exercise   PT Goals (Current goals can be found in the Care Plan section)  Frequency Min 3X/week           End of Session Equipment Utilized During Treatment: Gait belt;Oxygen Activity Tolerance: Patient tolerated treatment well Patient left: in chair;with call bell/phone within reach;with family/visitor present           Time: 3244-0102 PT Time Calculation (min) (ACUTE ONLY): 65 min   Charges:   PT Evaluation $Initial PT Evaluation Tier I: 1 Procedure PT Treatments $Gait  Training: 8-22 mins   PT G Codes:        RUSSELL,CINDY 15-May-2014, 9:33 AM

## 2014-04-17 NOTE — Progress Notes (Signed)
Patient was given 3 potassium tablets. Patient started to complain of a burning in chest. Received order to give malanta which was successful in relieving pain. No other complaints from patient. Will report to give tablets one at a time about 3 minutes apart.

## 2014-04-18 DIAGNOSIS — J9620 Acute and chronic respiratory failure, unspecified whether with hypoxia or hypercapnia: Secondary | ICD-10-CM

## 2014-04-18 LAB — GLUCOSE, CAPILLARY
GLUCOSE-CAPILLARY: 219 mg/dL — AB (ref 70–99)
GLUCOSE-CAPILLARY: 330 mg/dL — AB (ref 70–99)

## 2014-04-18 LAB — BASIC METABOLIC PANEL
Anion gap: 12 (ref 5–15)
BUN: 40 mg/dL — AB (ref 6–23)
CO2: 41 mmol/L (ref 19–32)
CREATININE: 0.98 mg/dL (ref 0.50–1.10)
Calcium: 8.5 mg/dL (ref 8.4–10.5)
Chloride: 90 mmol/L — ABNORMAL LOW (ref 96–112)
GFR calc Af Amer: 67 mL/min — ABNORMAL LOW (ref >90)
GFR calc non Af Amer: 58 mL/min — ABNORMAL LOW (ref >90)
GLUCOSE: 315 mg/dL — AB (ref 70–99)
POTASSIUM: 3.8 mmol/L (ref 3.5–5.1)
Sodium: 143 mmol/L (ref 135–145)

## 2014-04-18 LAB — MAGNESIUM: MAGNESIUM: 2.2 mg/dL (ref 1.5–2.5)

## 2014-04-18 MED ORDER — FUROSEMIDE 20 MG PO TABS
20.0000 mg | ORAL_TABLET | Freq: Every day | ORAL | Status: DC
  Administered 2014-04-18 – 2014-04-19 (×2): 20 mg via ORAL
  Filled 2014-04-18 (×2): qty 1

## 2014-04-18 MED ORDER — LORAZEPAM 0.5 MG PO TABS
0.5000 mg | ORAL_TABLET | Freq: Every evening | ORAL | Status: DC | PRN
Start: 2014-04-18 — End: 2014-04-19
  Administered 2014-04-18: 0.5 mg via ORAL
  Filled 2014-04-18: qty 1

## 2014-04-18 MED ORDER — METHYLPREDNISOLONE SODIUM SUCC 125 MG IJ SOLR
80.0000 mg | Freq: Two times a day (BID) | INTRAMUSCULAR | Status: DC
  Administered 2014-04-18: 80 mg via INTRAVENOUS
  Filled 2014-04-18: qty 2

## 2014-04-18 NOTE — Progress Notes (Signed)
Called report to [phone] RN and gave all information including patient being on heated high flow oxygen. No other questions asked.

## 2014-04-18 NOTE — Progress Notes (Signed)
Subjective: She says she feels better. She has less cough and congestion and is less short of breath.  Objective: Vital signs in last 24 hours: Temp:  [97.6 F (36.4 C)-98.8 F (37.1 C)] 97.6 F (36.4 C) (02/18 0400) Pulse Rate:  [82-113] 82 (02/18 0600) Resp:  [14-32] 21 (02/18 0600) BP: (87-141)/(45-86) 103/54 mmHg (02/18 0600) SpO2:  [89 %-100 %] 98 % (02/18 0726) FiO2 (%):  [35 %-100 %] 70 % (02/18 0726) Weight:  [94.3 kg (207 lb 14.3 oz)] 94.3 kg (207 lb 14.3 oz) (02/18 0500) Weight change: -2.1 kg (-4 lb 10.1 oz) Last BM Date: 04/12/14  Intake/Output from previous day: 02/17 0701 - 02/18 0700 In: 600 [P.O.:600] Out: 2300 [Urine:2300]  PHYSICAL EXAM General appearance: alert, cooperative, mild distress and She has blackening of both eyes Resp: rhonchi bilaterally Cardio: regular rate and rhythm, S1, S2 normal, no murmur, click, rub or gallop GI: soft, non-tender; bowel sounds normal; no masses,  no organomegaly Extremities: extremities normal, atraumatic, no cyanosis or edema  Lab Results:  Results for orders placed or performed during the hospital encounter of 04/14/14 (from the past 48 hour(s))  Glucose, capillary     Status: Abnormal   Collection Time: 04/16/14 11:04 AM  Result Value Ref Range   Glucose-Capillary 276 (H) 70 - 99 mg/dL   Comment 1 Notify RN   Glucose, capillary     Status: Abnormal   Collection Time: 04/16/14  4:43 PM  Result Value Ref Range   Glucose-Capillary 194 (H) 70 - 99 mg/dL   Comment 1 Notify RN   Basic metabolic panel     Status: Abnormal   Collection Time: 04/17/14  4:21 AM  Result Value Ref Range   Sodium 140 135 - 145 mmol/L   Potassium 2.6 (LL) 3.5 - 5.1 mmol/L    Comment: CRITICAL RESULT CALLED TO, READ BACK BY AND VERIFIED WITH: MARTIN,S AT 6:25AM ON 04/17/14 BY FESTERMAN,C    Chloride 84 (L) 96 - 112 mmol/L   CO2 44 (HH) 19 - 32 mmol/L    Comment: CRITICAL RESULT CALLED TO, READ BACK BY AND VERIFIED WITH: MARTIN,S AT  6:25AM ON 04/17/14 BY FESTERMAN,C    Glucose, Bld 284 (H) 70 - 99 mg/dL   BUN 41 (H) 6 - 23 mg/dL   Creatinine, Ser 1.11 (H) 0.50 - 1.10 mg/dL   Calcium 8.4 8.4 - 10.5 mg/dL   GFR calc non Af Amer 49 (L) >90 mL/min   GFR calc Af Amer 57 (L) >90 mL/min    Comment: (NOTE) The eGFR has been calculated using the CKD EPI equation. This calculation has not been validated in all clinical situations. eGFR's persistently <90 mL/min signify possible Chronic Kidney Disease.    Anion gap 12 5 - 15  CBC     Status: Abnormal   Collection Time: 04/17/14  4:21 AM  Result Value Ref Range   WBC 6.5 4.0 - 10.5 K/uL   RBC 4.62 3.87 - 5.11 MIL/uL   Hemoglobin 13.0 12.0 - 15.0 g/dL   HCT 43.8 36.0 - 46.0 %   MCV 94.8 78.0 - 100.0 fL   MCH 28.1 26.0 - 34.0 pg   MCHC 29.7 (L) 30.0 - 36.0 g/dL   RDW 17.4 (H) 11.5 - 15.5 %   Platelets 294 150 - 400 K/uL  Basic metabolic panel     Status: Abnormal   Collection Time: 04/18/14  5:23 AM  Result Value Ref Range   Sodium 143 135 - 145  mmol/L   Potassium 3.8 3.5 - 5.1 mmol/L    Comment: DELTA CHECK NOTED   Chloride 90 (L) 96 - 112 mmol/L   CO2 41 (HH) 19 - 32 mmol/L    Comment: CRITICAL RESULT CALLED TO, READ BACK BY AND VERIFIED WITH: MARTIN,C AT 6:35AM ON 04/18/14 BY FESTERMAN,C    Glucose, Bld 315 (H) 70 - 99 mg/dL   BUN 40 (H) 6 - 23 mg/dL   Creatinine, Ser 0.98 0.50 - 1.10 mg/dL   Calcium 8.5 8.4 - 10.5 mg/dL   GFR calc non Af Amer 58 (L) >90 mL/min   GFR calc Af Amer 67 (L) >90 mL/min    Comment: (NOTE) The eGFR has been calculated using the CKD EPI equation. This calculation has not been validated in all clinical situations. eGFR's persistently <90 mL/min signify possible Chronic Kidney Disease.    Anion gap 12 5 - 15  Magnesium     Status: None   Collection Time: 04/18/14  5:23 AM  Result Value Ref Range   Magnesium 2.2 1.5 - 2.5 mg/dL    ABGS  Recent Labs  04/16/14 0500  PHART 7.496*  PO2ART 67.4*  TCO2 43.5  HCO3 49.4*    CULTURES Recent Results (from the past 240 hour(s))  MRSA PCR Screening     Status: None   Collection Time: 04/14/14 10:20 AM  Result Value Ref Range Status   MRSA by PCR NEGATIVE NEGATIVE Final    Comment:        The GeneXpert MRSA Assay (FDA approved for NASAL specimens only), is one component of a comprehensive MRSA colonization surveillance program. It is not intended to diagnose MRSA infection nor to guide or monitor treatment for MRSA infections.    Studies/Results: No results found.  Medications:  Prior to Admission:  Prescriptions prior to admission  Medication Sig Dispense Refill Last Dose  . alendronate (FOSAMAX) 70 MG tablet Take 1 tablet (70 mg total) by mouth once a week. Take with a full glass of water on an empty stomach. 12 tablet 1 Past Week at Unknown time  . aspirin EC 81 MG tablet Take 81 mg by mouth daily.   04/13/2014 at Unknown time  . Blood Glucose Monitoring Suppl (SOLUS V2 BLOOD GLUCOSE SYSTEM) DEVI Use as directed 1 Device 0   . Blood Pressure Monitoring (BLOOD PRESSURE MONITOR AUTOMAT) DEVI Use to check blood pressure twice weekly    401.9 1 Device 0 Taking  . budesonide-formoterol (SYMBICORT) 160-4.5 MCG/ACT inhaler Inhale 2 puffs into the lungs 2 (two) times daily. 1 Inhaler 3 unknown at Unknown time  . buPROPion (WELLBUTRIN SR) 200 MG 12 hr tablet Take 1 tablet (200 mg total) by mouth 2 (two) times daily. 180 tablet 1 04/13/2014 at Unknown time  . cholecalciferol (VITAMIN D) 1000 UNITS tablet Take 1,000 Units by mouth every morning.     04/13/2014 at Unknown time  . furosemide (LASIX) 20 MG tablet Take 1 tablet (20 mg total) by mouth daily. 30 tablet 3 04/13/2014 at Unknown time  . glipiZIDE (GLUCOTROL) 5 MG tablet Take 0.5 tablets (2.5 mg total) by mouth 2 (two) times daily before a meal. 90 tablet 1 04/13/2014 at Unknown time  . glucose blood (SOLUS V2 TEST) test strip Check sugar daily 100 each 5   . levalbuterol (XOPENEX) 0.31 MG/3ML nebulizer  solution Take 1 ampule by nebulization every 4 (four) hours as needed for wheezing.   Past Week at Unknown time  . lisinopril (PRINIVIL,ZESTRIL) 20  MG tablet Take 0.5 tablets (10 mg total) by mouth daily. (Patient taking differently: Take 20 mg by mouth daily. ) 45 tablet 1 04/13/2014 at Unknown time  . lovastatin (MEVACOR) 10 MG tablet TAKE 1 TABLET AT BEDTIME. 90 tablet 1 04/13/2014 at Unknown time  . metFORMIN (GLUCOPHAGE-XR) 500 MG 24 hr tablet Take 2,000 mg by mouth daily with breakfast.   04/13/2014 at Unknown time  . metFORMIN (GLUCOPHAGE-XR) 750 MG 24 hr tablet TAKE (1) TABLET TWICE A DAY. 180 tablet 1 04/13/2014 at Unknown time  . pramipexole (MIRAPEX) 1.5 MG tablet TAKE (1) TABLET TWICE A DAY AS NEEDED. (Patient taking differently: Take 1.5 mg by mouth 2 (two) times daily as needed (restless legs). Take 1 tablet daily at bedtime, then 1 tablet as needed for restless legs.) 60 tablet 5 unknown  . docusate (COLACE) 50 MG/5ML liquid 3 or 4 drops in right ear as needed to soften earwax (Patient not taking: Reported on 04/14/2014) 100 mL 0 Taking  . LORazepam (ATIVAN) 0.5 MG tablet Take 1 tablet (0.5 mg total) by mouth at bedtime. **May take up to three times daily as needed for upset/anxiety** (Patient not taking: Reported on 04/14/2014) 30 tablet 0 Taking  . lovastatin (MEVACOR) 10 MG tablet Take 1 tablet (10 mg total) by mouth at bedtime. (Patient not taking: Reported on 04/14/2014) 90 tablet 1   . meclizine (ANTIVERT) 25 MG tablet Take 1 tablet (25 mg total) by mouth 3 (three) times daily as needed for dizziness. (Patient not taking: Reported on 04/14/2014) 90 tablet 1   . mirtazapine (REMERON) 15 MG tablet Take 1 tablet (15 mg total) by mouth at bedtime. (Patient not taking: Reported on 04/14/2014) 90 tablet 0   . tiotropium (SPIRIVA HANDIHALER) 18 MCG inhalation capsule Place 1 capsule (18 mcg total) into inhaler and inhale daily. (Patient not taking: Reported on 04/14/2014) 90 capsule 1     Scheduled: . antiseptic oral rinse  7 mL Mouth Rinse q12n4p  . aspirin EC  81 mg Oral Daily  . budesonide-formoterol  2 puff Inhalation BID  . chlorhexidine  15 mL Mouth Rinse BID  . enoxaparin (LOVENOX) injection  40 mg Subcutaneous Daily  . guaiFENesin  1,200 mg Oral BID  . insulin aspart  0-15 Units Subcutaneous TID WC  . insulin aspart  0-5 Units Subcutaneous QHS  . ipratropium  0.5 mg Nebulization Q4H  . levalbuterol  0.63 mg Nebulization Q4H  . levofloxacin  750 mg Oral Daily  . methylPREDNISolone (SOLU-MEDROL) injection  80 mg Intravenous Q6H  . nystatin   Topical BID  . pravastatin  10 mg Oral q1800  . sodium chloride  3 mL Intravenous Q12H   Continuous:  WUJ:WJXBJY chloride, acetaminophen, alum & mag hydroxide-simeth, levalbuterol, morphine injection, ondansetron (ZOFRAN) IV, pramipexole, sodium chloride  Assesment: She was admitted with acute on chronic respiratory failure. She has COPD exacerbation and may have some element of pneumonia. It's difficult to tell on her chest x-ray whether she has infiltrates or whether this is simply pleural effusions or pulmonary edema from her previous film. She's been treated for all of the above. She is improving. She had previously been requiring BiPAP +90% oxygen but now she is on 35%. She is also doing well on high flow nasal oxygen. It's not totally clear how she was doing with her oxygenation at home but she was on 5 L of oxygen. Active Problems:   Depression   Essential hypertension, benign   Diabetes mellitus  COPD exacerbation   Acute on chronic respiratory failure   Acute diastolic CHF (congestive heart failure)   Acute on chronic respiratory failure with hypercapnia    Plan: Continue current treatments. She has been sitting up in a chair and working with physical therapy and is clearly improving with treatment of COPD/pneumonia/acute heart failure.    LOS: 4 days   Hosea Hanawalt L 04/18/2014, 7:47 AM

## 2014-04-18 NOTE — Progress Notes (Signed)
Inpatient Diabetes Program Recommendations  AACE/ADA: New Consensus Statement on Inpatient Glycemic Control (2013)  Target Ranges:  Prepandial:   less than 140 mg/dL      Peak postprandial:   less than 180 mg/dL (1-2 hours)      Critically ill patients:  140 - 180 mg/dL    Diabetes history:DM2 Outpatient Diabetes medications: Glipizide 2.5 mg BID, Metformin 2000 mg QAM with breakfast, Metformin 750 mg BID Current orders for Inpatient glycemic control: Novolog 0-15 units TID with meals, Novolog 0-5 units HS  Inpatient Diabetes Program Recommendations Insulin - Basal: Finger stick glucose readings not uploaded in chart from 2/17. Noted patient received Novolog 8 units at 7:52 on 2/17 (based on dose CBG was between 251-300 mg/dl), CBG was not checked at noon on 2/17, Novolog 15 units at 17:01 on 2/17 (based on dose CBG was over 351 mg/dl), and Novolog 5 units at 21:50 on 2/17 (based on dose CBG was over 351 mg/dl). Fasting glucose is 263 mg/dl at 8am this mornning. Note steroids were decreased. Anticipated patient will need basal insulin especially while ordered steroids. Please consider ordering Lantus 14 units Q24H starting now (based on 94 kg x 0.15 units).  Thanks, Orlando PennerMarie Laya Letendre, RN, MSN, CCRN, CDE Diabetes Coordinator Inpatient Diabetes Program [phone] (Team Pager) [phone] ([facility] office) [phone] [facility] office)

## 2014-04-18 NOTE — Progress Notes (Signed)
Physical Therapy Treatment Patient Details Name: Molly Mcdonald MRN: [id number] DOB: [birth year] Today's Date: 04/18/2014    History of Present Illness Molly Mcdonald is a 69 y.o. female with advanced COPD, chronically on 5 L of oxygen. Patient was brought to the emergency room today after she fell out of her bed this morning, hitting her nose and causing epistaxis. Her family noted to be significantly short of breath and they called 911. They report that she has been increasingly short of breath. The last several days as well as having progressively worsening confusion. She has a chronic cough which appears to be unchanged. Did not report any fevers. No vomiting or diarrhea. No chest pain. They've also noticed that her lower extremity edema has began to worsen.    PT Comments    Pt is now out of ICU and on the 3rd floor.  She is on high flow O2 which significantly limits her mobility.  Pt is alert and reports feeling stronger.  She was able to tolerate therapeutic exercise well with great care to pace her activity with controlled breathing.  She needed min assist to transfer to EOB and was able to use the walker to transfer to chair.  Her O2 lines would not allow for actual gait.  O2 sats remained in the 90s until she walked to the chair (2') where it dropped to 88%.  Resiratory therapist was present and delivered a breathing treatment.  Follow Up Recommendations  SNF     Equipment Recommendations  None recommended by PT    Recommendations for Other Services OT consult     Precautions / Restrictions Precautions Precautions: Fall Restrictions Weight Bearing Restrictions: No    Mobility  Bed Mobility   Bed Mobility: Supine to Sit     Supine to sit: Min assist;HOB elevated        Transfers Overall transfer level: Needs assistance Equipment used: Rolling walker (2 wheeled) Transfers: Sit to/from Stand Sit to Stand: Min assist             Ambulation/Gait Ambulation/Gait assistance: Supervision Ambulation Distance (Feet): 2 Feet Assistive device: Rolling walker (2 wheeled) Gait Pattern/deviations: WFL(Within Functional Limits) Gait velocity: appropriate for situation Gait velocity interpretation: Below normal speed for age/gender     Stairs            Wheelchair Mobility    Modified Rankin (Stroke Patients Only)       Balance Overall balance assessment: No apparent balance deficits (not formally assessed)                                  Cognition Arousal/Alertness: Awake/alert Behavior During Therapy: WFL for tasks assessed/performed Overall Cognitive Status: Within Functional Limits for tasks assessed                      Exercises General Exercises - Lower Extremity Ankle Circles/Pumps: AROM;Both;10 reps;Supine Quad Sets: AROM;10 reps;Supine Gluteal Sets: AROM;Both;10 reps;Supine Short Arc Quad: AROM;Both;10 reps;Supine Heel Slides: AROM;Both;10 reps;Supine Hip ABduction/ADduction: AROM;Both;10 reps;Supine    General Comments        Pertinent Vitals/Pain Pain Assessment: No/denies pain    Home Living                      Prior Function            PT Goals (current goals can now be found in the  care plan section) Progress towards PT goals: Progressing toward goals    Frequency  Min 3X/week    PT Plan Current plan remains appropriate    Co-evaluation             End of Session Equipment Utilized During Treatment: Gait belt;Oxygen Activity Tolerance: Patient tolerated treatment well Patient left: in chair;with call bell/phone within reach;with family/visitor present     Time: [id number] PT Time Calculation (min) (ACUTE ONLY): 40 min  Charges:  $Therapeutic Exercise: 8-22 mins $Therapeutic Activity: 8-22 mins                    G Codes:      Konrad PentaBrown, Yuritzy Zehring L 04/18/2014, 4:35 PM

## 2014-04-18 NOTE — Progress Notes (Signed)
PROGRESS NOTE  Molly Mcdonald ZOX:[id number] DOB: 15-Jul-1945 DOA: 04/14/2014 PCP: Sherlene Shams, MD  Summary: 69 year old woman with advanced COPD, oxygen dependent on 5 L, presented to the emergency department after falling out of bed. Noted to be short of breath and confused. Admitted for acute hypercapnic respiratory failure with respiratory acidosis, COPD exacerbation, acute CHF type unknown. Placed on BiPAP, antibiotics and Lasix with excellent diuresis.  Assessment/Plan: 1. Acute hypercapnic respiratory failure, acute respiratory acidosis; acute on chronic hypoxic respiratory failure. Continues to improve. Likely multifactorial: COPD, CHF, obesity hypoventilation, consider obstructive sleep apnea.  2. COPD exacerbation, oxygen dependent COPD. slowly improving.  3. Chronic hypoxic respiratory failure on 5 L nasal cannula at home. Continue high flow nasal cannula during day. BiPAP at night. 4. Acute encephalopathy. Resolved. Secondary to acute hypercapnic respiratory failure. 5. Acute diastolic congestive heart failure. Appears euvolemic at this point. 6. Diabetes mellitus. Remains stable. 7. Obesity unspecified  8. Hypokalemia. Repleted.   Continues to improve. Completes Levaquin today.  Continue nebs, oxygen; decrease steroids  Start oral diuretic in AM  Transfer to medical floor  Likely SNF 1-2 days  Code Status: full code DVT prophylaxis: Lovenox Family Communication: Discussed with son at bedside Disposition Plan: SNF  Brendia Sacks, MD  [facility]  Pager [phone] If 7PM-7AM, please contact night-coverage at [email or web address], password [facility] 04/18/2014, 8:25 AM  LOS: 4 days   Consultants:  Pulmonology  Physical therapy: Skilled nursing facility  Procedures:  BiPAP  2-D echocardiogram Study Conclusions  - Left ventricle: E/E&quot; over 45 suggesting very high EDP but 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 The cavity size was normal.  Wall thickness was normal. Systolic function was normal. The estimated ejection fraction was in the range of 55% to 60%. - Mitral valve: Severe MAC leaflet motion not well seen but doppler shows no MS. Valve area by continuity equation (using LVOT flow): 1.02 cm^2. - Left atrium: The atrium was severely dilated. - Right ventricle: The cavity size was moderately dilated. - Right atrium: The atrium was moderately dilated. - Pulmonary arteries: PA peak pressure: 52 mm Hg (S).  Antibiotics:  Ceftriaxone 2/14  Levaquin 2/14 >> 2/18  HPI/Subjective: "I feel wonderful". Breathing well, no complaints. Xanax really helped with sleep last night. Eating well.  Objective: Filed Vitals:   04/18/14 0400 04/18/14 0500 04/18/14 0600 04/18/14 0726  BP: 123/86 124/66 103/54   Pulse: 94 88 82   Temp: 97.6 F (36.4 C)     TempSrc: Axillary     Resp: Height:      Weight:  94.3 kg (207 lb 14.3 oz)    SpO2: 96% 94% 92% 98%    Intake/Output Summary (Last 24 hours) at 04/18/14 0825 Last data filed at 04/18/14 0500  Gross per 24 hour  Intake    600 ml  Output   2300 ml  Net  -1700 ml     Filed Weights   04/16/14 0500 04/17/14 0500 04/18/14 0500  Weight: 96.5 kg (212 lb 11.9 oz) 96.4 kg (212 lb 8.4 oz) 94.3 kg (207 lb 14.3 oz)    Exam:     Afebrile, vitals stable. General:  Appears comfortable, calm. Cardiovascular: Regular rate and rhythm, no murmur, rub or gallop. No lower extremity edema. Telemetry: Sinus rhythm, no arrhythmias  Respiratory: Clear to auscultation bilaterally, no wheezes, rales or rhonchi. Normal respiratory effort. Musculoskeletal: grossly normal tone bilateral upper and lower extremities Psychiatric: grossly normal mood and affect,  speech fluent and appropriate  Data Reviewed:  Urine output 2300  -10.8 L since admission  Capillary blood sugars stable  Potassium 3.8, BUN stable, creatinine has returned to normal, 0.98  Pertinent  data:  Labs   Imaging   CT chest: No evidence of PE. Atelectasis versus pneumonia. Small right pleural effusion. Marked emphysema. Other    Pending data:    Scheduled Meds: . antiseptic oral rinse  7 mL Mouth Rinse q12n4p  . aspirin EC  81 mg Oral Daily  . budesonide-formoterol  2 puff Inhalation BID  . chlorhexidine  15 mL Mouth Rinse BID  . enoxaparin (LOVENOX) injection  40 mg Subcutaneous Daily  . guaiFENesin  1,200 mg Oral BID  . insulin aspart  0-15 Units Subcutaneous TID WC  . insulin aspart  0-5 Units Subcutaneous QHS  . ipratropium  0.5 mg Nebulization Q4H  . levalbuterol  0.63 mg Nebulization Q4H  . levofloxacin  750 mg Oral Daily  . methylPREDNISolone (SOLU-MEDROL) injection  80 mg Intravenous Q6H  . nystatin   Topical BID  . pravastatin  10 mg Oral q1800  . sodium chloride  3 mL Intravenous Q12H   Continuous Infusions:   Active Problems:   Depression   Essential hypertension, benign   Diabetes mellitus   COPD exacerbation   Acute on chronic respiratory failure   Acute diastolic CHF (congestive heart failure)   Acute on chronic respiratory failure with hypercapnia   Time spent 20 minutes

## 2014-04-19 LAB — GLUCOSE, CAPILLARY
Glucose-Capillary: 211 mg/dL — ABNORMAL HIGH (ref 70–99)
Glucose-Capillary: 272 mg/dL — ABNORMAL HIGH (ref 70–99)

## 2014-04-19 MED ORDER — METFORMIN HCL ER 500 MG PO TB24: 2000.0000 mg | ORAL_TABLET | Freq: Every day | ORAL | Status: AC

## 2014-04-19 MED ORDER — INSULIN DETEMIR 100 UNIT/ML ~~LOC~~ SOLN
14.0000 [IU] | Freq: Every day | SUBCUTANEOUS | Status: DC
  Filled 2014-04-19 (×4): qty 0.14

## 2014-04-19 MED ORDER — LORAZEPAM 0.5 MG PO TABS: 0.5000 mg | ORAL_TABLET | Freq: Every day | ORAL | Status: AC

## 2014-04-19 MED ORDER — PREDNISONE 20 MG PO TABS: 40.0000 mg | ORAL_TABLET | Freq: Every day | ORAL | Status: DC

## 2014-04-19 MED ORDER — INSULIN DETEMIR 100 UNIT/ML ~~LOC~~ SOLN: 5.0000 [IU] | Freq: Every day | SUBCUTANEOUS | Status: AC

## 2014-04-19 MED ORDER — PREDNISONE 10 MG PO TABS: ORAL_TABLET | ORAL | Status: AC

## 2014-04-19 NOTE — Progress Notes (Signed)
PROGRESS NOTE  Molly Mcdonald EAV:[id number] DOB: [birth year] DOA: 04/14/2014 PCP: Sherlene Shams, MD  Summary: 69 year old woman with advanced COPD, oxygen dependent on 5 L, presented to the emergency department after falling out of bed. Noted to be short of breath and confused. Admitted for acute hypercapnic respiratory failure with respiratory acidosis, COPD exacerbation, acute CHF type unknown. Placed on BiPAP, antibiotics and Lasix with excellent diuresis.  Assessment/Plan: 1. Acute hypercapnic respiratory failure, acute respiratory acidosis; acute on chronic hypoxic respiratory failure. Appears to be at baseline. Likely multifactorial: COPD, acute diastolic CHF, suspected obesity hypoventilation, consider obstructive sleep apnea. CT with atelectasis, no compelling evidence to suggest pneumonia. 2. COPD exacerbation, oxygen dependent COPD. Appears to be at baseline. 3. Chronic hypercapnic and hypoxic respiratory failure on 5 L nasal cannula at home. Stable. Continue high flow nasal cannula during day. BiPAP at night. 4. Acute encephalopathy. Resolved. Secondary to acute hypercapnic respiratory failure.  5. Acute diastolic congestive heart failure. Appears euvolemic. 6. Diabetes mellitus. Blood sugars somewhat elevated on steroids. Start Levemir. 7. Obesity unspecified  8. Hypokalemia. Repleted.   Continues to improve. Has completed abx. Continue high-flow oxygen [place] during day and BiPAP at night.  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 The cavity size was normal. Wall thickness was normal. Systolic function was normal. The estimated ejection fraction was in the range of 55% to 60%. - Mitral valve: Severe MAC leaflet motion not well seen but doppler shows no MS. Valve area by continuity equation (using LVOT flow): 1.02 cm^2. - Left atrium: The atrium was severely dilated. - Right ventricle: The cavity size was moderately dilated. - Right atrium: The atrium was moderately dilated. - Pulmonary arteries: PA peak pressure: 52 mm Hg (S).  Antibiotics:  Ceftriaxone 2/14  Levaquin 2/14 >> 2/18  HPI/Subjective: Pulmonology notes improvement, will now follow more peripherally.  "I feel good". No complaints. Eating well. Breathing well.  Objective: Filed Vitals:   04/18/14 2327 04/19/14 0418 04/19/14 0627 04/19/14 0759  BP:   151/69   Pulse:   97   Temp:   98 F (36.7 C)   TempSrc:   Oral   Resp:   20   Height:      Weight:   98.2 kg (216 lb 7.9 oz)   SpO2: 96% 94% 95% 90%    Intake/Output Summary (Last 24 hours) at 04/19/14 1036 Last data filed at 04/19/14 0908  Gross per 24 hour  Intake    950 ml  Output    400 ml  Net    550 ml     Filed Weights   04/17/14 0500 04/18/14 0500 04/19/14 0627  Weight: 96.4 kg (212 lb 8.4 oz) 94.3 kg (207 lb 14.3 oz) 98.2 kg (216 lb 7.9 oz)    Exam:     Afebrile, vitals stable., oxygenation stable on hi-flow nasal cannula. General:  Appears calm and comfortable Cardiovascular: RRR, no m/r/g. No LE edema. Respiratory: CTA bilaterally, no w/r/r, fair air movement.  Normal respiratory effort. Speaks in full sentences. Musculoskeletal: grossly normal tone BUE/BLE; able to scoot up in bed without assistance, using both legs and arms Psychiatric: grossly normal mood and affect, speech fluent and appropriate Neurologic: grossly non-focal.  Data Reviewed:  Urine  output only partially measured; weights unreliable.  -9 L since admission  Capillary blood sugars high  Potassium 3.8, BUN stable, creatinine has returned to normal, 0.98  Pertinent data:  Labs  ABG 7.242/99/42 on admission  troponins negative   TSH WNL Imaging   CT chest: No evidence of PE. Atelectasis versus pneumonia. Small right pleural effusion. Marked emphysema. Other    Pending data:    Scheduled Meds: . antiseptic oral rinse  7 mL Mouth Rinse q12n4p  . aspirin EC  81 mg Oral Daily  . budesonide-formoterol  2 puff Inhalation BID  . chlorhexidine  15 mL Mouth Rinse BID  . enoxaparin (LOVENOX) injection  40 mg Subcutaneous Daily  . furosemide  20 mg Oral Daily  . guaiFENesin  1,200 mg Oral BID  . insulin aspart  0-15 Units Subcutaneous TID WC  . insulin aspart  0-5 Units Subcutaneous QHS  . ipratropium  0.5 mg Nebulization Q4H  . levalbuterol  0.63 mg Nebulization Q4H  . levofloxacin  750 mg Oral Daily  . methylPREDNISolone (SOLU-MEDROL) injection  80 mg Intravenous Q12H  . nystatin   Topical BID  . pravastatin  10 mg Oral q1800  . sodium chloride  3 mL Intravenous Q12H   Continuous Infusions:   Principal Problem:   Acute on chronic respiratory failure with hypercapnia Active Problems:   Essential hypertension, benign   Diabetes mellitus   COPD exacerbation   Acute on chronic respiratory failure with hypoxia   Acute diastolic CHF (congestive heart failure)

## 2014-04-19 NOTE — Clinical Social Work Note (Signed)
CSW notified by respiratory therapy that pt is requiring 60% high flow oxygen. CSW verified with Th[facility] that facility can accommodate this.    Derenda FennelKara Traevon Meiring, KentuckyLCS[place] [phone]

## 2014-04-19 NOTE — Clinical Social Work Placement (Signed)
Clinical Social Work Department CLINICAL SOCIAL WORK PLACEMENT NOTE 04/19/2014  Patient:  Molly Mcdonald,Molly Mcdonald  Account Number:  [id number] Admit date:  04/14/2014  Clinical Social Worker:  Tretha SciaraHEATHER [place] Witz, LCSW  Date/time:  04/19/2014 12:46 PM  Clinical Social Work is seeking post-discharge placement for this patient at the following level of care:   SKILLED NURSING   (*CSW will update this form in Epic as items are completed)   04/17/2014  [facility]family provided with [facility] Department of Clinical Social Work's list of facilities offering this level of care within the geographic area requested by the patient (or if unable, by the patient's family).  04/17/2014  Patient/family informed of their freedom to choose among providers that offer the needed level of care, that participate in Medicare, Medicaid or managed care program needed by the patient, have an available bed and are willing to accept the patient.  04/17/2014  Patient/family informed of [facility]' ownership interest in [facility], as well as of the fact that they are under no obligation to receive care at this facility.  PASARR submitted to EDS on 04/17/2014 PASARR number received on 04/17/2014  FL2 transmitted to all facilities in geographic area requested by pt/family on  04/17/2014 FL2 transmitted to all facilities within larger geographic area on 04/17/2014  Patient informed that his/her managed care company has contracts with or will negotiate with  certain facilities, including the following:     Patient/family informed of bed offers received:  04/19/2014 Patient chooses bed at [facility] Physician recommends and patient chooses bed at    [facility]  04/19/2014 Patient to be transferred to facility by [facility] Patient and family notified of transfer on 04/19/2014 Name of family member notified:    The following physician request were  entered in Epic:   Additional Comments:   Tretha SciaraHeather [place] Breeding, LCSW [phone]

## 2014-04-19 NOTE — Progress Notes (Signed)
Respiratory Therapy notified RN and nursing supervisor about patients continued usage of a heated high flow device. Patient has been on 60% FIO2 and 30L flow heated high flow nasal cannula; whereas, RT has been unable to successfully titrate FIO2. Nursing facility accepting this patient was also notified of patient's high demand of oxygen by case manager.

## 2014-04-19 NOTE — Progress Notes (Signed)
Inpatient Diabetes Program Recommendations  AACE/ADA: New Consensus Statement on Inpatient Glycemic Control (2013)  Target Ranges:  Prepandial:   less than 140 mg/dL      Peak postprandial:   less than 180 mg/dL (1-2 hours)      Critically ill patients:  140 - 180 mg/dL   Results for Molly Mcdonald, Molly Mcdonald (MRN [id number]) as of 04/19/2014 09:02  Ref. Range 04/18/2014 17:29 04/18/2014 22:41  Glucose-Capillary Latest Range: 70-99 mg/dL 409219 (H) 811330 (H)   Diabetes history:DM2 Outpatient Diabetes medications: Glipizide 2.5 mg BID, Metformin 2000 mg QAM with breakfast, Metformin 750 mg BID Current orders for Inpatient glycemic control: Novolog 0-15 units TID with meals, Novolog 0-5 units HS  Inpatient Diabetes Program Recommendations Insulin - Basal: Blood glucose ranged from 263-402 mg/dl on 9/14/782/18/16 and patient received a total of Novolog 32 units for correction on 2/18. Fasting glucose is 211 mg/dl this morning.  Please consider ordering Levemir 14 units daily starting now.  Note: Patient is currently ordered Solumedrol 80 mg Q12H which is contributing to hyperglycemia. Please note that all CBGs were not uploaded into the chart yesterday and CBG was up to 402 mg/dl at 29:5611:00 am on 2/132/18 and fasting glucose is 211 mg/dl this morning. Please consider ordering Levemir 14 units daily starting now. Once steroids are tapered, basal insulin will have to be titrated down.  Thanks, Orlando PennerMarie Ikey Omary, RN, MSN, CCRN, CDE Diabetes Coordinator Inpatient Diabetes Program [phone] (Team Pager) [phone] ([facility] office) [phone] [facility] office)

## 2014-04-19 NOTE — Progress Notes (Signed)
She says she feels well. She continues to improve. She is being set up for skilled care facility placement.  Exam shows she is awake and alert. Her heart rate is 97 respirations 20 blood pressure 151/69 O2 saturation 90% on nasal oxygen and temperature is 90.8. Her chest is clearing she still has some rhonchi. Her heart is regular.  She has COPD exacerbation combined with congestive heart failure. She is improving and plans are for her to be placed in a skilled care facility for rehabilitation.   I will plan to follow somewhat more peripherally since she is improving and set for skilled care facility

## 2014-04-19 NOTE — Discharge Summary (Signed)
Physician Discharge Summary  Molly Mcdonald ZOX:[id number] DOB: [birth year] DOA: 04/14/2014  PCP: Sherlene Shams, MD  Admit date: 04/14/2014 Discharge date: 04/19/2014  Recommendations for Outpatient Follow-up:  1. Continue treatment for COPD. Recommend continue BiPAP QHS and supplemental oxygen during day (high-flow nasal canula oxygen) 2. Chronic diastolic congestive heart failure, suggest following daily weights, recommend salt diet 3. Diabetes mellitus type 2. Started on Levaquin there during this admission while on high-dose steroids. Suggest sliding-scale insulin per facility and close monitoring of blood sugars. Likely will need to wean down insulin or even stop this as prednisone is weaned.   Discharge Diagnoses:  1. Acute hypercapnic respiratory failure, acute respiratory acidosis and acute on chronic hypoxic respiratory failure 2. COPD exacerbation 3. Chronic hypercapnic, hypoxic respiratory failure 4. Acute encephalopathy 5. Acute diastolic congestive heart failure 6. Diabetes mellitus type 2 7. Obesity unspecified   Discharge Condition: Improved  Disposition: SNF for short-term rehabilitation  Diet recommendation: Carb-modified diet, low-salt, heart healthy   Filed Weights   04/17/14 0500 04/18/14 0500 04/19/14 8119  Weight: 96.4 kg (212 lb 8.4 oz) 94.3 kg (207 lb 14.3 oz) 98.2 kg (216 lb 7.9 oz)    History of present illness:  69 year old woman with advanced COPD, oxygen dependent on 5 L, presented to the emergency department after falling out of bed. Noted to be short of breath and confused. Admitted for acute hypercapnic respiratory failure with respiratory acidosis, COPD exacerbation, acute CHF type unknown.   Hospital Course:  Molly Mcdonald was placed on BiPAP, broad-spectrum antibiotics and treated with Lasix with excellent diuresis. She was seen in consultation with pulmonology and treated with BiPAP each night and continued on high flow nasal cannula  oxygen. Treated also with steroids and antibiotics with gradual improvement. Her respiratory status is now at baseline. Also treated for acute diastolic heart failure during this admission with excellent diuresis, now appears to euvolemic. Other issues as below. Hospitalization was uncomplicated.   1. Acute hypercapnic respiratory failure, acute respiratory acidosis; acute on chronic hypoxic respiratory failure. Appears to be at baseline. Likely multifactorial: COPD, acute diastolic CHF, suspected obesity hypoventilation, consider obstructive sleep apnea. CT with atelectasis, no compelling evidence to suggest pneumonia. 2. COPD exacerbation, oxygen dependent COPD. Appears to be at baseline. 3. Chronic hypercapnic and hypoxic respiratory failure on 5 L nasal cannula at home. Stable. Continue high flow nasal cannula during day. BiPAP at night. 4. Acute encephalopathy. Resolved. Secondary to acute hypercapnic respiratory failure.  5. Acute diastolic congestive heart failure. Appears euvolemic. 6. Diabetes mellitus. Blood sugars somewhat elevated on steroids. Start Levemir. 7. Obesity unspecified  8. Hypokalemia. Repleted.  Consultants:  Pulmonology  Physical therapy: Skilled nursing facility  Procedures:  BiPAP  2-D echocardiogram Study Conclusions  - Left ventricle: E/E&quot; over 45 suggesting very high EDP but 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 The cavity size was normal. Wall thickness was normal. Systolic function was normal. The estimated ejection fraction was in the range of 55% to 60%. - Mitral valve: Severe MAC leaflet motion not well seen but doppler shows no MS. Valve area by continuity equation (using LVOT flow): 1.02 cm^2. - Left atrium: The atrium was severely dilated. - Right ventricle: The cavity size was moderately dilated. - Right atrium: The atrium was moderately dilated. - Pulmonary arteries: PA peak pressure: 52 mm Hg  (S).  Antibiotics:  Ceftriaxone 2/14  Levaquin 2/14 >> 2/18  Discharge Instructions   Current Discharge Medication List    START taking  these medications   Details  insulin detemir (LEVEMIR) 100 UNIT/ML injection Inject 0.05 mLs (5 Units total) into the skin daily.    predniSONE (DELTASONE) 10 MG tablet Start 2/20 . Take 40 mg by mouth daily for 3 days, then take 20 mg by mouth daily for 3 days, then take 10 mg by mouth daily for 3 days, then stop.      CONTINUE these medications which have CHANGED   Details  LORazepam (ATIVAN) 0.5 MG tablet Take 1 tablet (0.5 mg total) by mouth at bedtime. Qty: 10 tablet, Refills: 0    metFORMIN (GLUCOPHAGE-XR) 500 MG 24 hr tablet Take 4 tablets (2,000 mg total) by mouth at bedtime.      CONTINUE these medications which have NOT CHANGED   Details  alendronate (FOSAMAX) 70 MG tablet Take 1 tablet (70 mg total) by mouth once a week. Take with a full glass of water on an empty stomach. Qty: 12 tablet, Refills: 1    aspirin EC 81 MG tablet Take 81 mg by mouth daily.    Blood Glucose Monitoring Suppl (SOLUS V2 BLOOD GLUCOSE SYSTEM) DEVI Use as directed Qty: 1 Device, Refills: 0    Blood Pressure Monitoring (BLOOD PRESSURE MONITOR AUTOMAT) DEVI Use to check blood pressure twice weekly    401.9 Qty: 1 Device, Refills: 0    budesonide-formoterol (SYMBICORT) 160-4.5 MCG/ACT inhaler Inhale 2 puffs into the lungs 2 (two) times daily. Qty: 1 Inhaler, Refills: 3    buPROPion (WELLBUTRIN SR) 200 MG 12 hr tablet Take 1 tablet (200 mg total) by mouth 2 (two) times daily. Qty: 180 tablet, Refills: 1   Associated Diagnoses: Depression    cholecalciferol (VITAMIN D) 1000 UNITS tablet Take 1,000 Units by mouth every morning.      furosemide (LASIX) 20 MG tablet Take 1 tablet (20 mg total) by mouth daily. Qty: 30 tablet, Refills: 3    glipiZIDE (GLUCOTROL) 5 MG tablet Take 0.5 tablets (2.5 mg total) by mouth 2 (two) times daily before a  meal. Qty: 90 tablet, Refills: 1    glucose blood (SOLUS V2 TEST) test strip Check sugar daily Qty: 100 each, Refills: 5    levalbuterol (XOPENEX) 0.31 MG/3ML nebulizer solution Take 1 ampule by nebulization every 4 (four) hours as needed for wheezing.    lisinopril (PRINIVIL,ZESTRIL) 20 MG tablet Take 0.5 tablets (10 mg total) by mouth daily. Qty: 45 tablet, Refills: 1   Associated Diagnoses: Essential hypertension, benign    pramipexole (MIRAPEX) 1.5 MG tablet TAKE (1) TABLET TWICE A DAY AS NEEDED. Qty: 60 tablet, Refills: 5    lovastatin (MEVACOR) 10 MG tablet Take 1 tablet (10 mg total) by mouth at bedtime. Qty: 90 tablet, Refills: 1    tiotropium (SPIRIVA HANDIHALER) 18 MCG inhalation capsule Place 1 capsule (18 mcg total) into inhaler and inhale daily. Qty: 90 capsule, Refills: 1      STOP taking these medications     docusate (COLACE) 50 MG/5ML liquid      meclizine (ANTIVERT) 25 MG tablet      mirtazapine (REMERON) 15 MG tablet        Allergies  Allergen Reactions  . Tramadol Hives    The results of significant diagnostics from this hospitalization (including imaging, microbiology, ancillary and laboratory) are listed below for reference.    Significant Diagnostic Studies: Ct Angio Chest Pe W/cm &/or Wo Cm  04/15/2014   CLINICAL DATA:  Hypoxia.  History of COPD.  EXAM: CT ANGIOGRAPHY CHEST WITH  CONTRAST  TECHNIQUE: Multidetector CT imaging of the chest was performed using the standard protocol during bolus administration of intravenous contrast. Multiplanar CT image reconstructions and MIPs were obtained to evaluate the vascular anatomy.  CONTRAST:  100 mL OMNIPAQUE IOHEXOL 350 MG/ML SOLN  COMPARISON:  Plain film of the chest 04/14/2014. PA and lateral chest 03/09/2011.  FINDINGS: No pulmonary embolus is identified. The pulmonary outflow trunk is dilated with normal-appearing right and left main pulmonary arteries suggestive of pulmonary stenosis or possibly pulmonary  hypertension. Extensive calcific aortic and coronary atherosclerosis is noted. Bovine type aortic arch is incidentally noted. There is a small right pleural effusion. No left pleural effusion or pericardial effusion. No axillary, hilar or mediastinal lymphadenopathy is identified.  The lungs demonstrate extensive emphysematous change. There is bibasilar airspace disease dependently, much worse on the right. No nodule or mass is identified.  Visualized upper abdomen shows fatty infiltration of the liver. No focal bony abnormality is identified.  Review of the MIP images confirms the above findings.  IMPRESSION: Negative for pulmonary embolus.  Dependent bibasilar airspace disease, much worse on the right, could be due to atelectasis or pneumonia.  Small right pleural effusion.  Findings compatible with pulmonary stenosis or possibly pulmonary arterial hypertension.  Marked emphysema.  Cardiomegaly.  Extensive calcific aortic and coronary atherosclerosis.  Fatty infiltration of the liver.   Electronically Signed   By: Drusilla Kannerhomas  Dalessio M.D.   On: 04/15/2014 19:09   Dg Chest Port 1 View  04/14/2014   CLINICAL DATA:  Status post fall.  Shortness of breath.  EXAM: PORTABLE CHEST - 1 VIEW  COMPARISON:  03/09/2011  FINDINGS: Limited evaluation due to body habitus and technique. Enlarged cardiac and mediastinal contours. Bilateral mid and lower lung interstitial pulmonary opacities. Costophrenic angles are not well visualized.  IMPRESSION: Limited evaluation due to body habitus.  Cardiomegaly.  Bilateral mid lower lung interstitial pulmonary opacities may represent edema, atypical infection and/or atelectasis.  The costophrenic angles are not well visualized, small bilateral pleural effusions are not excluded.  Recommend repeat two view radiograph when patient clinically able for further evaluation of the pulmonary parenchyma.   Electronically Signed   By: Annia Beltrew  Davis M.D.   On: 04/14/2014 08:01     Microbiology: Recent Results (from the past 240 hour(s))  MRSA PCR Screening     Status: None   Collection Time: 04/14/14 10:20 AM  Result Value Ref Range Status   MRSA by PCR NEGATIVE NEGATIVE Final    Comment:        The GeneXpert MRSA Assay (FDA approved for NASAL specimens only), is one component of a comprehensive MRSA colonization surveillance program. It is not intended to diagnose MRSA infection nor to guide or monitor treatment for MRSA infections.      Labs: Basic Metabolic Panel:  Recent Labs Lab 04/14/14 0656 04/15/14 0441 04/16/14 0513 04/17/14 0421 04/18/14 0523  NA 140 141 141 140 143  K 4.7 4.3 3.5 2.6* 3.8  CL 93* 85* 86* 84* 90*  CO2 43* 46* 44* 44* 41*  GLUCOSE 248* 216* 150* 284* 315*  BUN 26* 26* 31* 41* 40*  CREATININE 0.85 0.86 0.98 1.11* 0.98  CALCIUM 9.0 9.0 8.8 8.4 8.5  MG  --   --   --   --  2.2   CBC:  Recent Labs Lab 04/14/14 0656 04/16/14 0513 04/17/14 0421  WBC 10.7* 8.1 6.5  HGB 12.7 12.4 13.0  HCT 46.5* 42.0 43.8  MCV 103.6* 96.1 94.8  PLT 265 285 294   Cardiac Enzymes:  Recent Labs Lab 04/14/14 0656 04/14/14 1129 04/14/14 1657 04/14/14 2223  TROPONINI <0.03 <0.03 <0.03 <0.03     Recent Labs  04/14/14 0656  BNP 560.0*    CBG:  Recent Labs Lab 04/16/14 0742 04/16/14 1104 04/16/14 1643 04/18/14 1729 04/18/14 2241  GLUCAP 193* 276* 194* 219* 330*    Principal Problem:   Acute on chronic respiratory failure with hypercapnia Active Problems:   Essential hypertension, benign   Diabetes mellitus   COPD exacerbation   Acute on chronic respiratory failure with hypoxia   Acute diastolic CHF (congestive heart failure)   Time coordinating discharge: 35 minutes  Signed:  Brendia Sacks, MD [facility] 04/19/2014, 11:32 AM

## 2014-04-19 NOTE — Clinical Social Work Note (Addendum)
CSW facilitated discharge.  CSW made bed offers.  [facility]. CSW spoke with Fayrene FearingJames at [facility]. CSW advised that patient had accepted bed offer.  CSW advised that patient would need Bi-PAP at night and 40% high flow oxygen.   CSW advised patient's son of discharge. Patient's son indicated that he planned on going to the facility today at 2.  CSW signing off.   Tretha SciaraHeather Jourdon Zimmerle, [place] [phone]

## 2014-04-20 LAB — GLUCOSE, CAPILLARY
GLUCOSE-CAPILLARY: 298 mg/dL — AB (ref 70–99)
GLUCOSE-CAPILLARY: 356 mg/dL — AB (ref 70–99)
GLUCOSE-CAPILLARY: 263 mg/dL — AB (ref 70–99)
GLUCOSE-CAPILLARY: 402 mg/dL — AB (ref 70–99)
Glucose-Capillary: 334 mg/dL — ABNORMAL HIGH (ref 70–99)
Glucose-Capillary: 296 mg/dL — ABNORMAL HIGH (ref 70–99)
Glucose-Capillary: 315 mg/dL — ABNORMAL HIGH (ref 70–99)

## 2014-06-30 DEATH — deceased

## 2016-07-12 IMAGING — CT CT ANGIO CHEST
2 of 6 series · 6 of 36 positions shown · IV contrast (Omnipaque 300)
Comparison: Plain film of the chest 04/14/2014. PA and lateral
chest 03/09/2011.

CLINICAL DATA: Hypoxia.  History of COPD.

EXAM:
CT ANGIOGRAPHY CHEST WITH CONTRAST
TECHNIQUE: Multidetector CT imaging of the chest was performed using the
standard protocol during bolus administration of intravenous
contrast. Multiplanar CT image reconstructions and MIPs were
obtained to evaluate the vascular anatomy.
CONTRAST:  100 mL OMNIPAQUE IOHEXOL 350 MG/ML SOLN

[Series 5: pe 3.0 b40f · axial · 0.63mm/px · z∈[-250,-58]mm · 5 of 96 slices shown]
[im 16/96  lung]
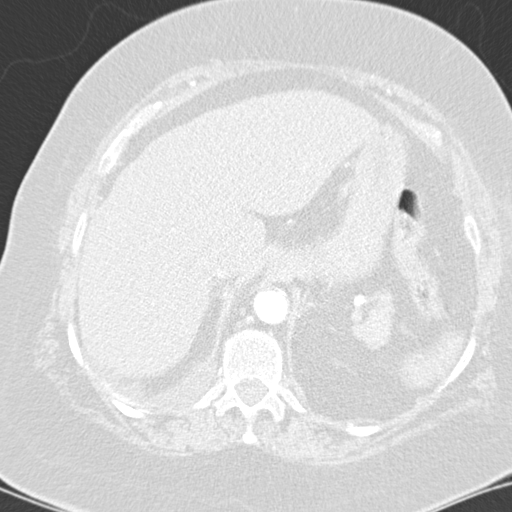
[im 32/96  mediastinal]
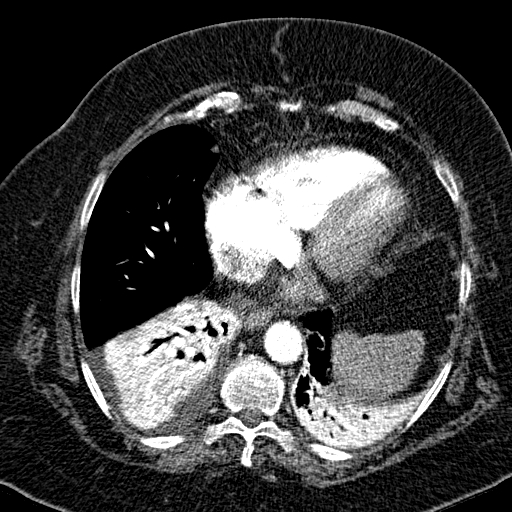
[im 48/96  lung]
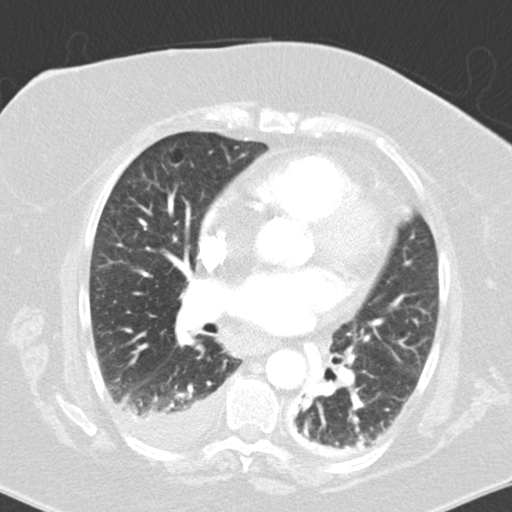
[im 64/96  mediastinal]
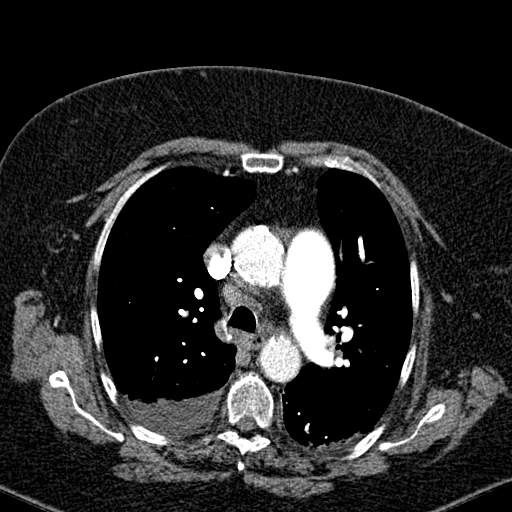
[im 80/96  lung]
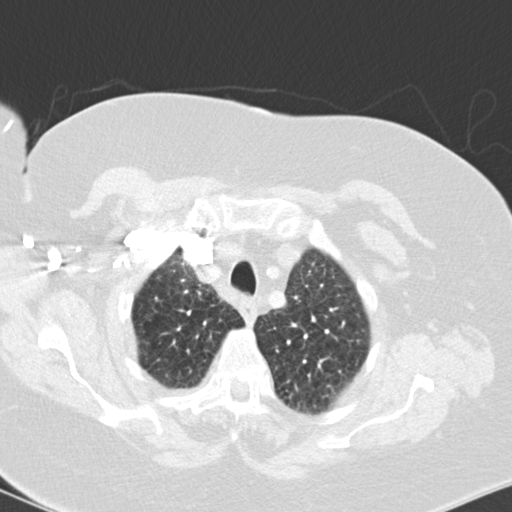

[Series 7: mpr coronal pe 3mm · coronal · 0.59mm/px · 1 of 100 slices shown]
[im 50/100  mediastinal]
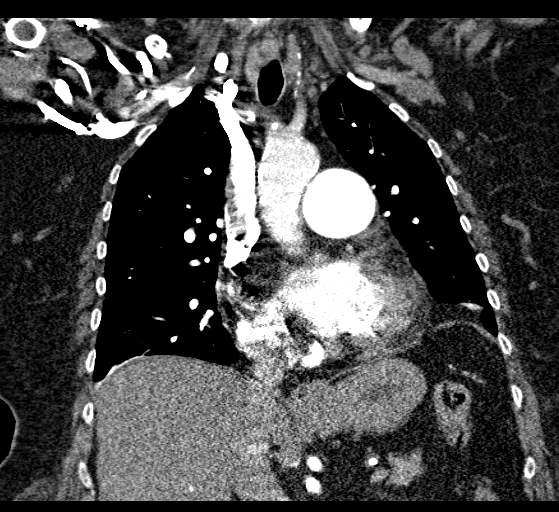

[6 of 36 positions shown; findings below may reference images not displayed]

FINDINGS: No pulmonary embolus is identified. The pulmonary outflow trunk is
dilated with normal-appearing right and left main pulmonary arteries
suggestive of pulmonary stenosis or possibly pulmonary hypertension.
Extensive calcific aortic and coronary atherosclerosis is noted.
Bovine type aortic arch is incidentally noted. There is a small
right pleural effusion. No left pleural effusion or pericardial
effusion. No axillary, hilar or mediastinal lymphadenopathy is
identified.

The lungs demonstrate extensive emphysematous change. There is
bibasilar airspace disease dependently, much worse on the right. No
nodule or mass is identified.

Visualized upper abdomen shows fatty infiltration of the liver. No
focal bony abnormality is identified.

Review of the MIP images confirms the above findings.
IMPRESSION: Negative for pulmonary embolus.

Dependent bibasilar airspace disease, much worse on the right, could
be due to atelectasis or pneumonia.

Small right pleural effusion.

Findings compatible with pulmonary stenosis or possibly pulmonary
arterial hypertension.

Marked emphysema.

Cardiomegaly.

Extensive calcific aortic and coronary atherosclerosis.

Fatty infiltration of the liver.
